# Patient Record
Sex: Female | Born: 1985 | Race: Black or African American | Hispanic: No | Marital: Single | Smoking: Never smoker
Health system: Southern US, Community
[De-identification: ages and names within clinical notes are randomized; demographics above are authoritative.]

## PROBLEM LIST (undated history)

## (undated) ENCOUNTER — Emergency Department: Admission: EM | Payer: Medicaid Other

## (undated) DIAGNOSIS — E079 Disorder of thyroid, unspecified: Secondary | ICD-10-CM

## (undated) DIAGNOSIS — H3500 Unspecified background retinopathy: Secondary | ICD-10-CM

## (undated) DIAGNOSIS — E039 Hypothyroidism, unspecified: Secondary | ICD-10-CM

## (undated) DIAGNOSIS — I10 Essential (primary) hypertension: Secondary | ICD-10-CM

## (undated) DIAGNOSIS — E119 Type 2 diabetes mellitus without complications: Secondary | ICD-10-CM

## (undated) DIAGNOSIS — E669 Obesity, unspecified: Secondary | ICD-10-CM

## (undated) HISTORY — PX: NO PAST SURGERIES: SHX2092

## (undated) HISTORY — DX: Disorder of thyroid, unspecified: E07.9

## (undated) HISTORY — DX: Obesity, unspecified: E66.9

## (undated) HISTORY — DX: Type 2 diabetes mellitus without complications: E11.9

---
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
## 2008-05-26 ENCOUNTER — Emergency Department (HOSPITAL_COMMUNITY): Admission: EM | Admit: 2008-05-26 | Discharge: 2008-05-26 | Payer: Self-pay | Admitting: Emergency Medicine

## 2008-08-08 ENCOUNTER — Other Ambulatory Visit: Admission: RE | Admit: 2008-08-08 | Discharge: 2008-08-08 | Payer: Self-pay | Admitting: Obstetrics and Gynecology

## 2009-04-28 ENCOUNTER — Emergency Department (HOSPITAL_COMMUNITY): Admission: EM | Admit: 2009-04-28 | Discharge: 2009-04-28 | Payer: Self-pay | Admitting: Emergency Medicine

## 2010-05-13 LAB — URINALYSIS, ROUTINE W REFLEX MICROSCOPIC
Glucose, UA: NEGATIVE mg/dL
Hgb urine dipstick: NEGATIVE
Ketones, ur: NEGATIVE mg/dL
Nitrite: NEGATIVE
Protein, ur: NEGATIVE mg/dL
Specific Gravity, Urine: 1.03 (ref 1.005–1.030)
Urobilinogen, UA: 0.2 mg/dL (ref 0.0–1.0)
pH: 5.5 (ref 5.0–8.0)

## 2010-05-13 LAB — DIFFERENTIAL
Basophils Absolute: 0 K/uL (ref 0.0–0.1)
Basophils Relative: 0 % (ref 0–1)
Eosinophils Absolute: 0.2 K/uL (ref 0.0–0.7)
Eosinophils Relative: 4 % (ref 0–5)
Lymphocytes Relative: 31 % (ref 12–46)
Lymphs Abs: 1.7 K/uL (ref 0.7–4.0)
Monocytes Absolute: 0.5 K/uL (ref 0.1–1.0)
Monocytes Relative: 8 % (ref 3–12)
Neutro Abs: 3.1 K/uL (ref 1.7–7.7)
Neutrophils Relative %: 56 % (ref 43–77)

## 2010-05-13 LAB — COMPREHENSIVE METABOLIC PANEL
ALT: 45 U/L — ABNORMAL HIGH (ref 0–35)
AST: 33 U/L (ref 0–37)
Albumin: 3.8 g/dL (ref 3.5–5.2)
Alkaline Phosphatase: 80 U/L (ref 39–117)
BUN: 13 mg/dL (ref 6–23)
CO2: 22 mEq/L (ref 19–32)
Calcium: 8.9 mg/dL (ref 8.4–10.5)
Chloride: 106 mEq/L (ref 96–112)
Creatinine, Ser: 1.15 mg/dL (ref 0.4–1.2)
GFR calc Af Amer: >60 mL/min (ref >60)
GFR calc non Af Amer: 59 mL/min — ABNORMAL LOW (ref >60)
Glucose, Bld: 121 mg/dL — ABNORMAL HIGH (ref 70–99)
Potassium: 3.5 mEq/L (ref 3.5–5.1)
Sodium: 139 mEq/L (ref 135–145)
Total Bilirubin: 0.7 mg/dL (ref 0.3–1.2)
Total Protein: 8.2 g/dL (ref 6.0–8.3)

## 2010-05-13 LAB — CBC
HCT: 46.3 % — ABNORMAL HIGH (ref 36.0–46.0)
Hemoglobin: 15.1 g/dL — ABNORMAL HIGH (ref 12.0–15.0)
MCHC: 32.8 g/dL (ref 30.0–36.0)
MCV: 81.2 fL (ref 78.0–100.0)
Platelets: 321 10*3/uL (ref 150–400)
RBC: 5.7 MIL/uL — ABNORMAL HIGH (ref 3.87–5.11)
RDW: 14.4 % (ref 11.5–15.5)
WBC: 5.5 10*3/uL (ref 4.0–10.5)

## 2010-05-13 LAB — POCT PREGNANCY, URINE: Preg Test, Ur: NEGATIVE

## 2010-05-13 LAB — LIPASE, BLOOD: Lipase: 20 U/L (ref 11–59)

## 2010-05-29 LAB — POCT URINALYSIS DIP (DEVICE)
Bilirubin Urine: NEGATIVE
Glucose, UA: NEGATIVE mg/dL
Hgb urine dipstick: NEGATIVE
Nitrite: NEGATIVE
Protein, ur: NEGATIVE mg/dL
Specific Gravity, Urine: 1.025 (ref 1.005–1.030)
Urobilinogen, UA: 0.2 mg/dL (ref 0.0–1.0)
pH: 5.5 (ref 5.0–8.0)

## 2010-05-29 LAB — POCT PREGNANCY, URINE: Preg Test, Ur: NEGATIVE

## 2010-06-23 IMAGING — CR DG LUMBAR SPINE 2-3V
3 series · 3 of 3 positions shown · non-contrast
Comparison: None

CLINICAL DATA: Low back pain

LUMBAR SPINE - 2-3 VIEW

[view not recorded (1 of 3)]
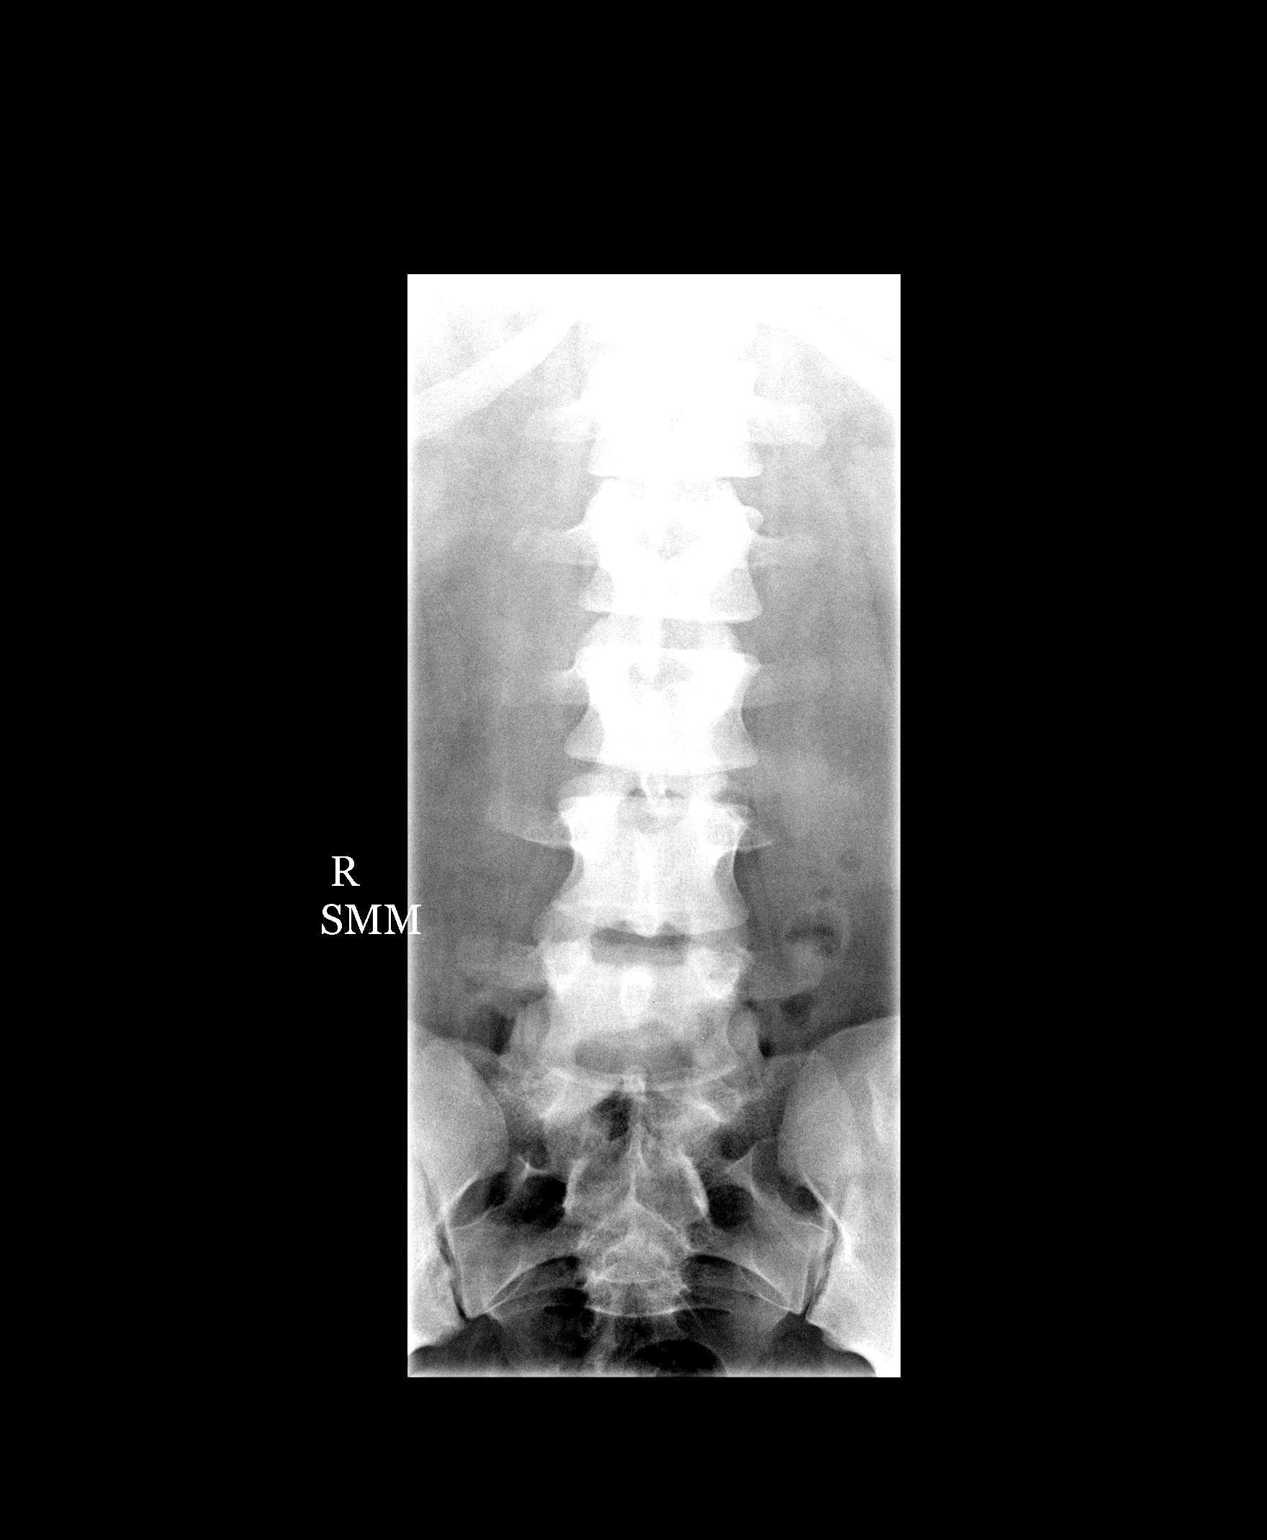

[view not recorded (2 of 3)]
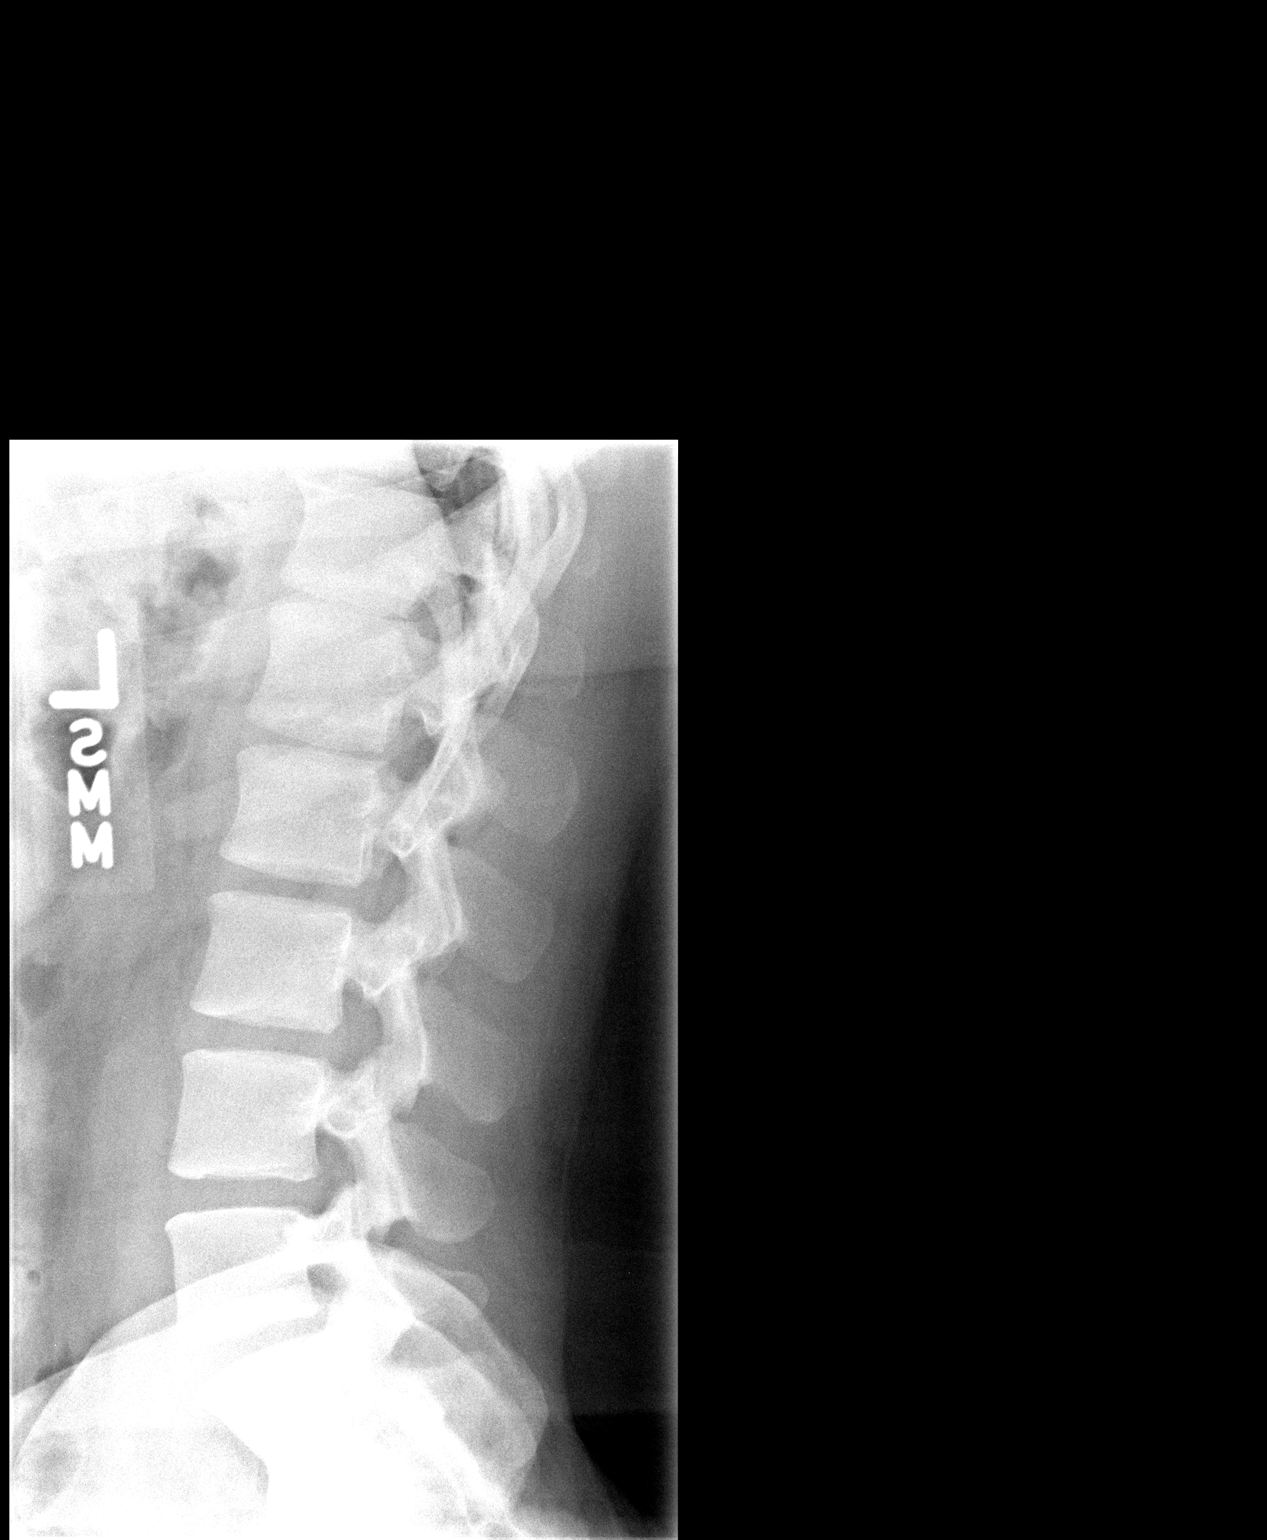

[view not recorded (3 of 3)]
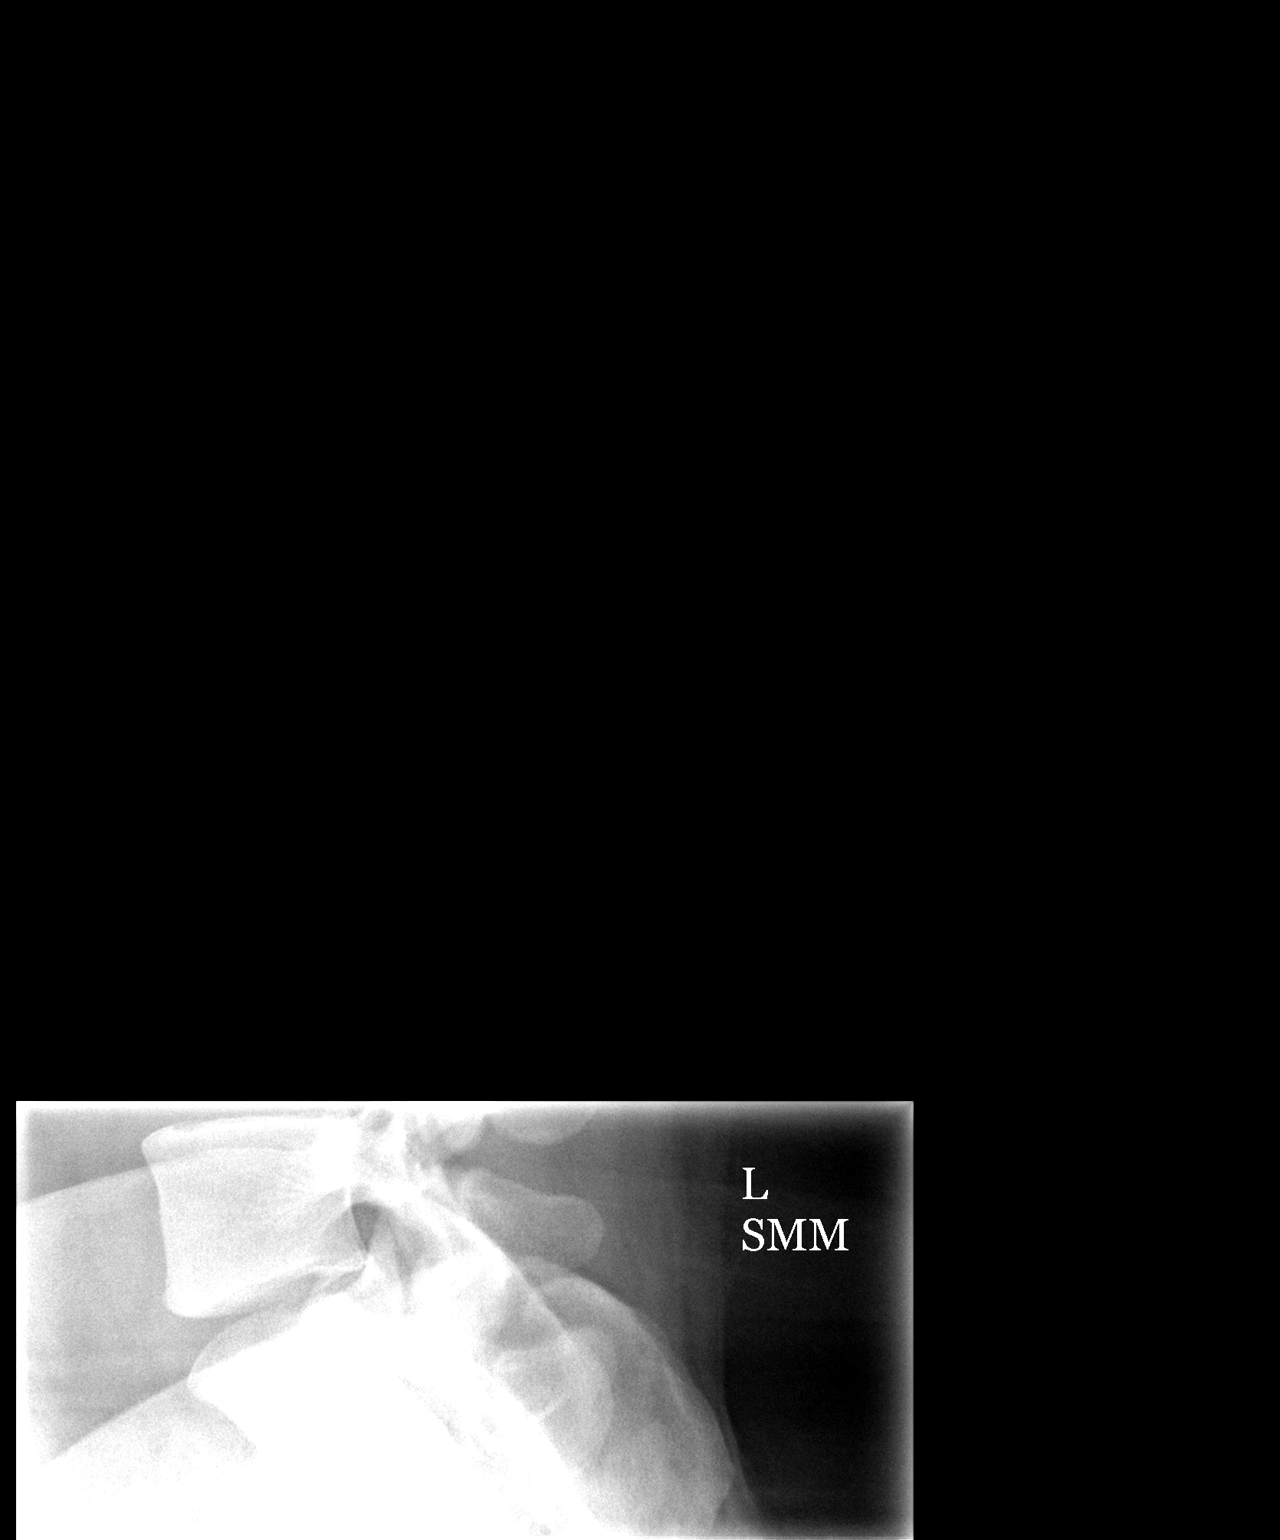

[3 of 3 positions shown; findings below may reference images not displayed]

FINDINGS: Anatomic alignment.  No fracture.  There is no
subluxation.
IMPRESSION: Lumbar spine radiographs are unremarkable.

## 2010-08-05 ENCOUNTER — Inpatient Hospital Stay (INDEPENDENT_AMBULATORY_CARE_PROVIDER_SITE_OTHER)
Admission: RE | Admit: 2010-08-05 | Discharge: 2010-08-05 | Disposition: A | Discharge status: Home / Self Care | Payer: BC Managed Care – PPO | Source: Ambulatory Visit | Attending: Family Medicine | Admitting: Family Medicine

## 2010-08-05 DIAGNOSIS — S335XXA Sprain of ligaments of lumbar spine, initial encounter: Secondary | ICD-10-CM

## 2011-02-25 ENCOUNTER — Other Ambulatory Visit: Payer: Self-pay | Admitting: Nurse Practitioner

## 2011-02-25 ENCOUNTER — Other Ambulatory Visit (HOSPITAL_COMMUNITY)
Admission: RE | Admit: 2011-02-25 | Discharge: 2011-02-25 | Disposition: A | Discharge status: Home / Self Care | Payer: BC Managed Care – PPO | Source: Ambulatory Visit | Attending: Obstetrics and Gynecology | Admitting: Obstetrics and Gynecology

## 2011-02-25 DIAGNOSIS — Z01419 Encounter for gynecological examination (general) (routine) without abnormal findings: Secondary | ICD-10-CM | POA: Insufficient documentation

## 2011-02-25 DIAGNOSIS — Z113 Encounter for screening for infections with a predominantly sexual mode of transmission: Secondary | ICD-10-CM | POA: Insufficient documentation

## 2011-02-25 DIAGNOSIS — N760 Acute vaginitis: Secondary | ICD-10-CM | POA: Insufficient documentation

## 2012-10-12 ENCOUNTER — Encounter: Payer: BC Managed Care – PPO | Attending: Family Medicine

## 2012-10-12 VITALS — Ht 64.0 in | Wt 312.0 lb

## 2012-10-12 DIAGNOSIS — Z713 Dietary counseling and surveillance: Secondary | ICD-10-CM | POA: Insufficient documentation

## 2012-10-12 DIAGNOSIS — E119 Type 2 diabetes mellitus without complications: Secondary | ICD-10-CM

## 2012-10-16 NOTE — Progress Notes (Signed)
Patient was seen on 10/12/12 for the first of a series of three diabetes self-management courses at the [facility].   Current HbA1c: 8.6%  The following learning objectives were met by the patient during this course:   Defines the role of glucose and insulin  Identifies type of diabetes and pathophysiology  Defines the diagnostic criteria for diabetes and prediabetes  States the risk factors for Type 2 Diabetes  States the symptoms of Type 2 Diabetes  Defines Type 2 Diabetes treatment goals  Defines Type 2 Diabetes treatment options  States the rationale for glucose monitoring  Identifies A1C, glucose targets, and testing times  Identifies proper sharps disposal  Defines the purpose of a diabetes food plan  Identifies carbohydrate food groups  Defines effects of carbohydrate foods on glucose levels  Identifies carbohydrate choices/grams/food labels  States benefits of physical activity and effect on glucose  Review of suggested activity guidelines  Handouts given during class include:  Type 2 Diabetes: Basics Book  My Food Plan Book  Food and Activity Log  Your patient has identified their diabetes self-care support plan as:  [facility] support group  Continued diabetes education  Follow-Up Plan: Attend core 2 and core 3

## 2012-10-16 NOTE — Patient Instructions (Signed)
Goals:  Follow Diabetes Meal Plan as instructed  Eat 3 meals and 2 snacks, every 3-5 hrs  Limit carbohydrate intake to 60 grams carbohydrate/meal  Limit carbohydrate intake to 0-30 grams carbohydrate/snack  Add lean protein foods to meals/snacks  Monitor glucose levels as instructed by your doctor  Aim for 15-30 mins of physical activity daily  Bring food record and glucose log to your next nutrition visit 

## 2012-10-19 ENCOUNTER — Encounter: Payer: BC Managed Care – PPO | Attending: Family Medicine

## 2012-10-19 DIAGNOSIS — Z713 Dietary counseling and surveillance: Secondary | ICD-10-CM | POA: Insufficient documentation

## 2012-10-19 DIAGNOSIS — E119 Type 2 diabetes mellitus without complications: Secondary | ICD-10-CM | POA: Insufficient documentation

## 2012-10-20 NOTE — Progress Notes (Signed)
Patient was seen on 10/19/12 for the second of a series of three diabetes self-management courses at the [facility]. The following learning objectives were met by the patient during this course:   Explain basic nutrition maintenance and quality assurance  Describe causes, symptoms and treatment of hypoglycemia and hyperglycemia  Explain how to manage diabetes during illness  Describe the importance of good nutrition for health and healthy eating strategies  List strategies to follow meal plan when dining out  Describe the effects of alcohol on glucose and how to use it safely  Describe problem solving skills for day-to-day glucose challenges  Describe strategies to use when treatment plan needs to change  Identify important factors involved in successful weight loss  Describe ways to remain physically active  Describe the impact of regular activity on insulin resistance  Identify current diabetes medications, their action on blood glucose, and [pssible side effects.  Handouts given in class:  Refrigerator magnet for Sick Day Guidelines  [facility] Oral medication/insulin handout  Your patient has identified their diabetes self-care support plan as:  [facility] support group  Follow-Up Plan: Patient will attend the final class of the ADA Diabetes Self-Care Education.   

## 2013-01-18 ENCOUNTER — Encounter: Payer: BC Managed Care – PPO | Attending: Family Medicine

## 2013-01-18 DIAGNOSIS — E119 Type 2 diabetes mellitus without complications: Secondary | ICD-10-CM | POA: Insufficient documentation

## 2013-01-18 DIAGNOSIS — Z713 Dietary counseling and surveillance: Secondary | ICD-10-CM | POA: Insufficient documentation

## 2013-05-24 ENCOUNTER — Ambulatory Visit: Payer: BC Managed Care – PPO | Admitting: *Deleted

## 2016-10-11 ENCOUNTER — Encounter (HOSPITAL_BASED_OUTPATIENT_CLINIC_OR_DEPARTMENT_OTHER): Payer: Self-pay

## 2016-10-11 ENCOUNTER — Emergency Department (HOSPITAL_BASED_OUTPATIENT_CLINIC_OR_DEPARTMENT_OTHER)
Admission: EM | Admit: 2016-10-11 | Discharge: 2016-10-11 | Disposition: A | Discharge status: Home / Self Care | Payer: PRIVATE HEALTH INSURANCE | Attending: Emergency Medicine | Admitting: Emergency Medicine

## 2016-10-11 ENCOUNTER — Emergency Department (HOSPITAL_BASED_OUTPATIENT_CLINIC_OR_DEPARTMENT_OTHER): Payer: PRIVATE HEALTH INSURANCE

## 2016-10-11 DIAGNOSIS — O209 Hemorrhage in early pregnancy, unspecified: Secondary | ICD-10-CM | POA: Insufficient documentation

## 2016-10-11 DIAGNOSIS — O24111 Pre-existing diabetes mellitus, type 2, in pregnancy, first trimester: Secondary | ICD-10-CM | POA: Insufficient documentation

## 2016-10-11 DIAGNOSIS — Z3A01 Less than 8 weeks gestation of pregnancy: Secondary | ICD-10-CM | POA: Diagnosis not present

## 2016-10-11 DIAGNOSIS — Z79899 Other long term (current) drug therapy: Secondary | ICD-10-CM | POA: Diagnosis not present

## 2016-10-11 DIAGNOSIS — R102 Pelvic and perineal pain: Secondary | ICD-10-CM | POA: Insufficient documentation

## 2016-10-11 DIAGNOSIS — O26891 Other specified pregnancy related conditions, first trimester: Secondary | ICD-10-CM | POA: Insufficient documentation

## 2016-10-11 DIAGNOSIS — E119 Type 2 diabetes mellitus without complications: Secondary | ICD-10-CM | POA: Diagnosis not present

## 2016-10-11 DIAGNOSIS — Z7984 Long term (current) use of oral hypoglycemic drugs: Secondary | ICD-10-CM | POA: Diagnosis not present

## 2016-10-11 DIAGNOSIS — O4690 Antepartum hemorrhage, unspecified, unspecified trimester: Secondary | ICD-10-CM

## 2016-10-11 DIAGNOSIS — O3680X0 Pregnancy with inconclusive fetal viability, not applicable or unspecified: Secondary | ICD-10-CM

## 2016-10-11 LAB — CBC WITH DIFFERENTIAL/PLATELET
Basophils Absolute: 0 10*3/uL (ref 0.0–0.1)
Basophils Relative: 0 %
Eosinophils Absolute: 0.1 10*3/uL (ref 0.0–0.7)
Eosinophils Relative: 1 %
HCT: 39.7 % (ref 36.0–46.0)
Hemoglobin: 13.3 g/dL (ref 12.0–15.0)
Lymphocytes Relative: 20 %
Lymphs Abs: 2.6 10*3/uL (ref 0.7–4.0)
MCH: 26.3 pg (ref 26.0–34.0)
MCHC: 33.5 g/dL (ref 30.0–36.0)
MCV: 78.6 fL (ref 78.0–100.0)
Monocytes Absolute: 0.7 10*3/uL (ref 0.1–1.0)
Monocytes Relative: 5 %
Neutro Abs: 10 10*3/uL — ABNORMAL HIGH (ref 1.7–7.7)
Neutrophils Relative %: 74 %
Platelets: 298 10*3/uL (ref 150–400)
RBC: 5.05 MIL/uL (ref 3.87–5.11)
RDW: 14.9 % (ref 11.5–15.5)
WBC: 13.5 10*3/uL — ABNORMAL HIGH (ref 4.0–10.5)

## 2016-10-11 LAB — BASIC METABOLIC PANEL
Anion gap: 9 (ref 5–15)
BUN: 18 mg/dL (ref 6–20)
CO2: 21 mmol/L — ABNORMAL LOW (ref 22–32)
Calcium: 8.7 mg/dL — ABNORMAL LOW (ref 8.9–10.3)
Chloride: 104 mmol/L (ref 101–111)
Creatinine, Ser: 1.04 mg/dL — ABNORMAL HIGH (ref 0.44–1.00)
GFR calc Af Amer: >60 mL/min (ref >60)
GFR calc non Af Amer: >60 mL/min (ref >60)
Glucose, Bld: 281 mg/dL — ABNORMAL HIGH (ref 65–99)
Potassium: 4.1 mmol/L (ref 3.5–5.1)
Sodium: 134 mmol/L — ABNORMAL LOW (ref 135–145)

## 2016-10-11 LAB — URINALYSIS, ROUTINE W REFLEX MICROSCOPIC
Bilirubin Urine: NEGATIVE
Glucose, UA: >=500 mg/dL — AB
Ketones, ur: 15 mg/dL — AB
Nitrite: NEGATIVE
Protein, ur: 30 mg/dL — AB
Specific Gravity, Urine: 1.031 — ABNORMAL HIGH (ref 1.005–1.030)
pH: 5.5 (ref 5.0–8.0)

## 2016-10-11 LAB — URINALYSIS, MICROSCOPIC (REFLEX)

## 2016-10-11 LAB — HCG, QUANTITATIVE, PREGNANCY: hCG, Beta Chain, Quant, S: 133 m[IU]/mL — ABNORMAL HIGH (ref <5)

## 2016-10-11 MED ORDER — SODIUM CHLORIDE 0.9 % IV BOLUS (SEPSIS)
1000.0000 mL | Freq: Once | INTRAVENOUS | Status: AC
Start: 2016-10-11 — End: 2016-10-11
  Administered 2016-10-11: 1000 mL via INTRAVENOUS

## 2016-10-11 NOTE — ED Triage Notes (Signed)
Pt reports LMP 7/9 - states + home HCG test on Sunday. Reports vaginal bleeding and passing blood clots today. First pregnancy.

## 2016-10-11 NOTE — Discharge Instructions (Signed)
[facility] recheck and reevaluation of the laboratory testing.  The were unable to identify gestational sac in the uterus at this time.  This could be due to the fact that it is an early pregnancy or possible miscarriage or possible ectopic pregnancy.  The labs will need to be repeated, and possibly an ultrasound as needed

## 2016-10-13 NOTE — ED Provider Notes (Signed)
[facility] Provider Note   CSN: [id number] Arrival date & time: 10/11/16  0945     History   Chief Complaint Chief Complaint  Patient presents with  . Vaginal Bleeding    HPI Brenda Casey is a 31 y.o. female.  HPI Patient presents to the emergency department with a positive home pregnancy test.  6 days ago.  The patient states that yesterday she did have some bleeding with some clots noted in the evening.  She states this morning the bleeding was less but still noted some spotting.  Patient states she does not have any significant abdominal discomfort. The patient denies chest pain, shortness of breath, headache,blurred vision, neck pain, fever, cough, weakness, numbness, dizziness, anorexia, edema, abdominal pain, nausea, vomiting, diarrhea, rash, back pain, dysuria, hematemesis, bloody stool, near syncope, or syncope. Past Medical History:  Diagnosis Date  . Diabetes mellitus without complication (HCC)   . Obesity   . Thyroid disease     There are no active problems to display for this patient.   History reviewed. No pertinent surgical history.  OB History    Gravida Para Term Preterm AB Living   1             SAB TAB Ectopic Multiple Live Births                   Home Medications    Prior to Admission medications   Medication Sig Start Date End Date Taking? Authorizing Provider  levothyroxine (SYNTHROID, LEVOTHROID) 25 MCG tablet Take 25 mcg by mouth daily before breakfast.    [provider]  metFORMIN (GLUCOPHAGE) 500 MG tablet Take 500 mg by mouth 2 (two) times daily with a meal.    [provider]    Family History History reviewed. No pertinent family history.  Social History Social History  Substance Use Topics  . Smoking status: Never Smoker  . Smokeless tobacco: Never Used  . Alcohol use No     Allergies   Patient has no known allergies.   Review of Systems Review of Systems All other systems negative except  as documented in the HPI. All pertinent positives and negatives as reviewed in the HPI.  Physical Exam Updated Vital Signs BP (!) 161/95 (BP Location: Right Arm)   Pulse 98   Temp 98.5 F (36.9 C) (Oral)   Resp 19   Ht 5\' 4"  (1.626 m)   Wt 123.8 kg (273 lb)   LMP 08/25/2016   SpO2 100%   BMI 46.86 kg/m   Physical Exam  Constitutional: She is oriented to person, place, and time. She appears well-developed and well-nourished. No distress.  HENT:  Head: Normocephalic and atraumatic.  Mouth/Throat: Oropharynx is clear and moist.  Eyes: Pupils are equal, round, and reactive to light.  Neck: Normal range of motion. Neck supple.  Cardiovascular: Normal rate, regular rhythm and normal heart sounds.  Exam reveals no gallop and no friction rub.   No murmur heard. Pulmonary/Chest: Effort normal and breath sounds normal. No respiratory distress. She has no wheezes.  Abdominal: Soft. Bowel sounds are normal. She exhibits no distension and no mass. There is no tenderness. There is no rebound and no guarding.  Neurological: She is alert and oriented to person, place, and time. She exhibits normal muscle tone. Coordination normal.  Skin: Skin is warm and dry. Capillary refill takes less than 2 seconds. No rash noted. No erythema.  Psychiatric: She has a normal mood and affect. Her behavior  is normal.  Nursing note and vitals reviewed.    ED Treatments / Results  Labs (all labs ordered are listed, but only abnormal results are displayed) Labs Reviewed  BASIC METABOLIC PANEL - Abnormal; Notable for the following:       Result Value   Sodium 134 (*)    CO2 21 (*)    Glucose, Bld 281 (*)    Creatinine, Ser 1.04 (*)    Calcium 8.7 (*)    All other components within normal limits  CBC WITH DIFFERENTIAL/PLATELET - Abnormal; Notable for the following:    WBC 13.5 (*)    Neutro Abs 10.0 (*)    All other components within normal limits  URINALYSIS, ROUTINE W REFLEX MICROSCOPIC - Abnormal;  Notable for the following:    Specific Gravity, Urine 1.031 (*)    Glucose, UA >=500 (*)    Hgb urine dipstick LARGE (*)    Ketones, ur 15 (*)    Protein, ur 30 (*)    Leukocytes, UA SMALL (*)    All other components within normal limits  HCG, QUANTITATIVE, PREGNANCY - Abnormal; Notable for the following:    hCG, Beta Chain, Quant, S 133 (*)    All other components within normal limits  URINALYSIS, MICROSCOPIC (REFLEX) - Abnormal; Notable for the following:    Bacteria, UA MANY (*)    Squamous Epithelial / LPF 0-5 (*)    All other components within normal limits    EKG  EKG Interpretation None       Radiology No results found.  Procedures Procedures (including critical care time)  Medications Ordered in ED Medications  sodium chloride 0.9 % bolus 1,000 mL (0 mLs Intravenous Stopped 10/11/16 1305)     Initial Impression / Assessment and Plan / ED Course  I have reviewed the triage vital signs and the nursing notes.  Pertinent labs & imaging results that were available during my care of the patient were reviewed by me and considered in my medical decision making (see chart for details).     The patient will have an ultrasound.  Her Sharene Butters is fairly low, and there is no intrauterine pregnancy identified at this time.  [facility] the patient and they will follow up with her in 2 days for a recheck of her Quant.. Patient is advised this still could represent ectopic pregnancy or possible miscarriage.  Patient agrees the plan and all questions were answered  Final Clinical Impressions(s) / ED Diagnoses   Final diagnoses:  Pregnancy of unknown anatomic location    New Prescriptions Discharge Medication List as of 10/11/2016  1:01 PM       Charlestine Night, PA-C 10/13/16 1625    Tilden Fossa, MD 10/15/16 [phone]

## 2017-10-24 ENCOUNTER — Encounter (HOSPITAL_BASED_OUTPATIENT_CLINIC_OR_DEPARTMENT_OTHER): Payer: Self-pay | Admitting: Emergency Medicine

## 2017-10-24 ENCOUNTER — Emergency Department (HOSPITAL_BASED_OUTPATIENT_CLINIC_OR_DEPARTMENT_OTHER)
Admission: EM | Admit: 2017-10-24 | Discharge: 2017-10-25 | Disposition: A | Discharge status: Home / Self Care | Payer: PRIVATE HEALTH INSURANCE | Attending: Emergency Medicine | Admitting: Emergency Medicine

## 2017-10-24 ENCOUNTER — Other Ambulatory Visit: Payer: Self-pay

## 2017-10-24 DIAGNOSIS — Z79899 Other long term (current) drug therapy: Secondary | ICD-10-CM | POA: Insufficient documentation

## 2017-10-24 DIAGNOSIS — E079 Disorder of thyroid, unspecified: Secondary | ICD-10-CM | POA: Insufficient documentation

## 2017-10-24 DIAGNOSIS — J020 Streptococcal pharyngitis: Secondary | ICD-10-CM | POA: Insufficient documentation

## 2017-10-24 DIAGNOSIS — E119 Type 2 diabetes mellitus without complications: Secondary | ICD-10-CM | POA: Insufficient documentation

## 2017-10-24 DIAGNOSIS — L02416 Cutaneous abscess of left lower limb: Secondary | ICD-10-CM

## 2017-10-24 DIAGNOSIS — R509 Fever, unspecified: Secondary | ICD-10-CM | POA: Insufficient documentation

## 2017-10-24 DIAGNOSIS — R070 Pain in throat: Secondary | ICD-10-CM | POA: Insufficient documentation

## 2017-10-24 LAB — GROUP A STREP BY PCR: Group A Strep by PCR: DETECTED — AB

## 2017-10-24 MED ORDER — ACETAMINOPHEN 325 MG PO TABS
650.0000 mg | ORAL_TABLET | Freq: Once | ORAL | Status: AC | PRN
  Administered 2017-10-24: 650 mg via ORAL
  Filled 2017-10-24: qty 2

## 2017-10-24 NOTE — ED Provider Notes (Signed)
[facility] EMERGENCY DEPARTMENT Provider Note   CSN: [id number] Arrival date & time: 10/24/17  2117     History   Chief Complaint Chief Complaint  Patient presents with  . Insect Bite    HPI Brenda Casey is a 32 y.o. female.  The history is provided by the patient.  She has history of diabetes, and comes in complaining of 2 bumps in her left thigh posteriorly.  They have been present for about 3 days.  1 of them is tender and the other is not.  She thought it might of been an insect bite and she tried soaking in some warm water without any improvement.  She was not aware of any fever, but was noted to have a fever at triage.  She denies chills or sweats.  She is also had a sore throat for the last 5 days.  Past Medical History:  Diagnosis Date  . Diabetes mellitus without complication (HCC)   . Obesity   . Thyroid disease     There are no active problems to display for this patient.   History reviewed. No pertinent surgical history.   OB History    Gravida  1   Para      Term      Preterm      AB      Living        SAB      TAB      Ectopic      Multiple      Live Births               Home Medications    Prior to Admission medications   Medication Sig Start Date End Date Taking? Authorizing Provider  levothyroxine (SYNTHROID, LEVOTHROID) 25 MCG tablet Take 25 mcg by mouth daily before breakfast.    [provider]  metFORMIN (GLUCOPHAGE) 500 MG tablet Take 500 mg by mouth 2 (two) times daily with a meal.    [provider]    Family History No family history on file.  Social History Social History   Tobacco Use  . Smoking status: Never Smoker  . Smokeless tobacco: Never Used  Substance Use Topics  . Alcohol use: No  . Drug use: No     Allergies   Patient has no known allergies.   Review of Systems Review of Systems  All other systems reviewed and are negative.    Physical Exam Updated  Vital Signs BP (!) 165/112 (BP Location: Right Arm)   Pulse 93   Temp 99.1 F (37.3 C) (Oral)   Resp 20   Ht 5\' 4"  (1.626 m)   Wt 115.2 kg   LMP 09/30/2017   SpO2 100%   Breastfeeding? Unknown   BMI 43.60 kg/m   Physical Exam  Nursing note and vitals reviewed.  32 year old female, resting comfortably and in no acute distress. Vital signs are significant for elevated blood pressure and low-grade fever. Oxygen saturation is 100%, which is normal. Head is normocephalic and atraumatic. PERRLA, EOMI. Oropharynx shows tonsillar hypertrophy without erythema or exudate. Neck is nontender and supple without adenopathy or JVD. Back is nontender and there is no CVA tenderness. Lungs are clear without rales, wheezes, or rhonchi. Chest is nontender. Heart has regular rate and rhythm without murmur. Abdomen is soft, flat, nontender without masses or hepatosplenomegaly and peristalsis is normoactive. Extremities have no cyanosis or edema, full range of motion is present.  Indurated area in  the left posterior thigh approximately measuring 5 cm x 5 cm.  Second area of induration which is nontender and measures 1 cm x 1 cm. Skin is warm and dry without other rash. Neurologic: Mental status is normal, cranial nerves are intact, there are no motor or sensory deficits.  ED Treatments / Results  Labs (all labs ordered are listed, but only abnormal results are displayed) Labs Reviewed  GROUP A STREP BY PCR - Abnormal; Notable for the following components:      Result Value   Group A Strep by PCR DETECTED (*)    All other components within normal limits   Procedures [place] bedside Date/Time: 10/25/2017 12:09 AM Performed by: Dione Booze, MD Authorized by: Dione Booze, MD  Consent: Verbal consent obtained. Written consent not obtained. Risks and benefits: risks, benefits and alternatives were discussed Consent given by: patient Patient understanding: patient states understanding of the procedure  being performed Patient consent: the patient's understanding of the procedure matches consent given Procedure consent: procedure consent matches procedure scheduled Relevant documents: relevant documents present and verified Site marked: the operative site was marked Required items: required blood products, implants, devices, and special equipment available Patient identity confirmed: verbally with patient and arm band Time out: Immediately prior to procedure a "time out" was called to verify the correct patient, procedure, equipment, support staff and site/side marked as required. Local anesthesia used: no  Anesthesia: Local anesthesia used: no  Sedation: Patient sedated: no  Patient tolerance: Patient tolerated the procedure well with no immediate complications Comments: Ultrasound shows apparent fluid collection in the 5 cm x 5 cm lesion, no fluid collection associated with a 1 cm x 1 cm lesion.  Images archived electronically.  Marland Kitchen.Incision and Drainage Date/Time: 10/25/2017 12:10 AM Performed by: Dione Booze, MD Authorized by: Dione Booze, MD   Consent:    Consent obtained:  Verbal   Consent given by:  Patient   Risks discussed:  Bleeding, pain and incomplete drainage   Alternatives discussed:  Alternative treatment Location:    Type:  Abscess   Size:  5 cm x 5 cm   Location:  Lower extremity   Lower extremity location:  Leg   Leg location:  L upper leg Pre-procedure details:    Skin preparation:  Chloraprep Anesthesia (see MAR for exact dosages):    Anesthesia method:  Local infiltration   Local anesthetic:  Lidocaine 1% w/o epi Procedure type:    Complexity:  Complex Procedure details:    Needle aspiration: no     Incision types:  Cruciate   Incision depth:  Subcutaneous   Scalpel blade:  11   Wound management:  Probed and deloculated   Drainage:  Purulent   Drainage amount:  Moderate   Wound treatment:  Wound left open   Packing materials:   None Post-procedure details:    Patient tolerance of procedure:  Tolerated well, no immediate complications    Medications Ordered in ED Medications  acetaminophen (TYLENOL) tablet 650 mg (650 mg Oral Given 10/24/17 2154)     Initial Impression / Assessment and Plan / ED Course  I have reviewed the triage vital signs and the nursing notes.  Pertinent labs & imaging results that were available during my care of the patient were reviewed by me and considered in my medical decision making (see chart for details).  Abscess of the left thigh.  Second lesion is indurated, but ultrasound does not show any fluid collection.  Sore throat.  Strep screen  is positive.  Abscesses treated with incision and drainage.  Will treat with oral doxycycline which should treat MRSA as well as strep adequately  (pregnancy test obtained prior to ordering doxycycline).  Return precautions discussed.  Advised that the small lesion might still develop into a clinical abscess even with taking the antibiotic.  Final Clinical Impressions(s) / ED Diagnoses   Final diagnoses:  Abscess of left thigh  Streptococcal pharyngitis    ED Discharge Orders         Ordered    doxycycline (VIBRAMYCIN) 100 MG capsule  2 times daily     10/25/17 0047           Dione Booze, MD 10/25/17 [phone]

## 2017-10-24 NOTE — ED Triage Notes (Addendum)
C/o ?insect bite under L buttock. 2 raised areas without drainage x 2-3 days. Pt tried warm compress without relief. Pt does not recall feeling a bite or seeing an insect in the area. Pt also noted a sore throat.

## 2017-10-25 LAB — CBG MONITORING, ED: Glucose-Capillary: 279 mg/dL — ABNORMAL HIGH (ref 70–99)

## 2017-10-25 LAB — PREGNANCY, URINE: Preg Test, Ur: NEGATIVE

## 2017-10-25 MED ORDER — DOXYCYCLINE HYCLATE 100 MG PO CAPS: 100.0000 mg | ORAL_CAPSULE | Freq: Two times a day (BID) | ORAL | 0 refills | Status: DC

## 2017-10-25 MED ORDER — LIDOCAINE HCL (PF) 1 % IJ SOLN
5.0000 mL | Freq: Once | INTRAMUSCULAR | Status: DC
  Filled 2017-10-25: qty 5

## 2017-10-25 MED ORDER — DOXYCYCLINE HYCLATE 100 MG PO TABS
100.0000 mg | ORAL_TABLET | Freq: Once | ORAL | Status: AC
  Administered 2017-10-25: 100 mg via ORAL
  Filled 2017-10-25: qty 1

## 2017-10-25 NOTE — ED Notes (Signed)
Pt understood dc material. NAD noted. Script given at dc  

## 2017-10-25 NOTE — Discharge Instructions (Addendum)
Return if you are having any problems.  Beware, even though the smaller lesion on your leg did not show any pus on ultrasound today, sometimes a lesion like this grows into a large abscess, even with taking the antibiotic.

## 2017-10-25 NOTE — ED Notes (Signed)
ED Provider at bedside. 

## 2018-01-24 ENCOUNTER — Encounter (HOSPITAL_BASED_OUTPATIENT_CLINIC_OR_DEPARTMENT_OTHER): Payer: Self-pay | Admitting: Emergency Medicine

## 2018-01-24 ENCOUNTER — Other Ambulatory Visit: Payer: Self-pay

## 2018-01-24 ENCOUNTER — Emergency Department (HOSPITAL_BASED_OUTPATIENT_CLINIC_OR_DEPARTMENT_OTHER)
Admission: EM | Admit: 2018-01-24 | Discharge: 2018-01-24 | Disposition: A | Discharge status: Home / Self Care | Payer: Self-pay | Attending: Emergency Medicine | Admitting: Emergency Medicine

## 2018-01-24 ENCOUNTER — Emergency Department (HOSPITAL_BASED_OUTPATIENT_CLINIC_OR_DEPARTMENT_OTHER): Payer: Self-pay

## 2018-01-24 DIAGNOSIS — E119 Type 2 diabetes mellitus without complications: Secondary | ICD-10-CM | POA: Insufficient documentation

## 2018-01-24 DIAGNOSIS — O24111 Pre-existing diabetes mellitus, type 2, in pregnancy, first trimester: Secondary | ICD-10-CM | POA: Insufficient documentation

## 2018-01-24 DIAGNOSIS — Z79899 Other long term (current) drug therapy: Secondary | ICD-10-CM | POA: Insufficient documentation

## 2018-01-24 DIAGNOSIS — O200 Threatened abortion: Secondary | ICD-10-CM | POA: Insufficient documentation

## 2018-01-24 DIAGNOSIS — Z3A01 Less than 8 weeks gestation of pregnancy: Secondary | ICD-10-CM | POA: Insufficient documentation

## 2018-01-24 DIAGNOSIS — O23591 Infection of other part of genital tract in pregnancy, first trimester: Secondary | ICD-10-CM | POA: Insufficient documentation

## 2018-01-24 DIAGNOSIS — O039 Complete or unspecified spontaneous abortion without complication: Secondary | ICD-10-CM

## 2018-01-24 DIAGNOSIS — N760 Acute vaginitis: Secondary | ICD-10-CM

## 2018-01-24 DIAGNOSIS — Z794 Long term (current) use of insulin: Secondary | ICD-10-CM | POA: Insufficient documentation

## 2018-01-24 DIAGNOSIS — B9689 Other specified bacterial agents as the cause of diseases classified elsewhere: Secondary | ICD-10-CM

## 2018-01-24 LAB — WET PREP, GENITAL
Sperm: NONE SEEN
Trich, Wet Prep: NONE SEEN
Yeast Wet Prep HPF POC: NONE SEEN

## 2018-01-24 LAB — CBC WITH DIFFERENTIAL/PLATELET
Abs Immature Granulocytes: 0.05 10*3/uL (ref 0.00–0.07)
Basophils Absolute: 0 10*3/uL (ref 0.0–0.1)
Basophils Relative: 0 %
Eosinophils Absolute: 0.1 10*3/uL (ref 0.0–0.5)
Eosinophils Relative: 1 %
HCT: 41.9 % (ref 36.0–46.0)
Hemoglobin: 13 g/dL (ref 12.0–15.0)
Immature Granulocytes: 0 %
Lymphocytes Relative: 33 %
Lymphs Abs: 4 10*3/uL (ref 0.7–4.0)
MCH: 24.5 pg — ABNORMAL LOW (ref 26.0–34.0)
MCHC: 31 g/dL (ref 30.0–36.0)
MCV: 78.9 fL — ABNORMAL LOW (ref 80.0–100.0)
Monocytes Absolute: 0.5 10*3/uL (ref 0.1–1.0)
Monocytes Relative: 4 %
Neutro Abs: 7.3 10*3/uL (ref 1.7–7.7)
Neutrophils Relative %: 62 %
Platelets: 370 10*3/uL (ref 150–400)
RBC: 5.31 MIL/uL — ABNORMAL HIGH (ref 3.87–5.11)
RDW: 15.5 % (ref 11.5–15.5)
WBC: 11.9 10*3/uL — ABNORMAL HIGH (ref 4.0–10.5)
nRBC: 0 % (ref 0.0–0.2)

## 2018-01-24 LAB — URINALYSIS, MICROSCOPIC (REFLEX)

## 2018-01-24 LAB — COMPREHENSIVE METABOLIC PANEL
ALT: 19 U/L (ref 0–44)
AST: 16 U/L (ref 15–41)
Albumin: 3.5 g/dL (ref 3.5–5.0)
Alkaline Phosphatase: 61 U/L (ref 38–126)
Anion gap: 9 (ref 5–15)
BUN: 12 mg/dL (ref 6–20)
CO2: 22 mmol/L (ref 22–32)
Calcium: 9.1 mg/dL (ref 8.9–10.3)
Chloride: 104 mmol/L (ref 98–111)
Creatinine, Ser: 0.68 mg/dL (ref 0.44–1.00)
GFR calc Af Amer: >60 mL/min (ref >60)
GFR calc non Af Amer: >60 mL/min (ref >60)
Glucose, Bld: 173 mg/dL — ABNORMAL HIGH (ref 70–99)
Potassium: 3.6 mmol/L (ref 3.5–5.1)
Sodium: 135 mmol/L (ref 135–145)
Total Bilirubin: 0.6 mg/dL (ref 0.3–1.2)
Total Protein: 7.7 g/dL (ref 6.5–8.1)

## 2018-01-24 LAB — URINALYSIS, ROUTINE W REFLEX MICROSCOPIC
Bilirubin Urine: NEGATIVE
Glucose, UA: NEGATIVE mg/dL
Ketones, ur: NEGATIVE mg/dL
Leukocytes, UA: NEGATIVE
Nitrite: NEGATIVE
Protein, ur: NEGATIVE mg/dL
Specific Gravity, Urine: 1.025 (ref 1.005–1.030)
pH: 6 (ref 5.0–8.0)

## 2018-01-24 LAB — HCG, QUANTITATIVE, PREGNANCY: hCG, Beta Chain, Quant, S: 29585 m[IU]/mL — ABNORMAL HIGH (ref <5)

## 2018-01-24 LAB — ABO/RH: ABO/RH(D): O POS

## 2018-01-24 LAB — PREGNANCY, URINE: Preg Test, Ur: POSITIVE — AB

## 2018-01-24 MED ORDER — METRONIDAZOLE 500 MG PO TABS: 500.0000 mg | ORAL_TABLET | Freq: Two times a day (BID) | ORAL | 0 refills | Status: DC

## 2018-01-24 NOTE — ED Notes (Signed)
Patient transported to Ultrasound 

## 2018-01-24 NOTE — Discharge Instructions (Addendum)
You were seen in the ER today for vaginal bleeding.  Your work-up here shows that you are likely miscarrying and that the fetus will not make it to term.  Will need to follow-up very closely with an OB/GYN clinic within the next 2 days for reevaluation.  They are also treating you for bacterial vaginosis with Flagyl, this is an antibiotic, please take this as prescribed you drink alcohol with this medication as it can have severe side effects.  We have prescribed you new medication(s) today. Discuss the medications prescribed today with your pharmacist as they can have adverse effects and interactions with your other medicines including over the counter and prescribed medications. Seek medical evaluation if you start to experience new or abnormal symptoms after taking one of these medicines, seek care immediately if you start to experience difficulty breathing, feeling of your throat closing, facial swelling, or rash as these could be indications of a more serious allergic reaction  Your white blood cell count was slightly high at 11.9, have this rechecked by primary care, your blood sugar was also high at 173, also send in to have rechecked by primary care.  Your blood pressure was high, please get this rechecked at your appointment as well.  Please follow-up with primary care in 1 to 2 weeks, please follow-up with OB/GYN within 2 days.  Please return to the ER immediately should you experience new or worsening symptoms including but not limited to increasing bleeding, lightheadedness, dizziness, passing out, pelvic pain, fever, or any other concerns.

## 2018-01-24 NOTE — ED Triage Notes (Signed)
Pt states she just found out that she is pregnant and today when she wipe out she noticed some bloody secretion, pt states she has hx of spontaneous abortion and she will like to check for this.

## 2018-01-24 NOTE — ED Notes (Signed)
Patient verbalizes understanding of discharge instructions. Opportunity for questioning and answers were provided. Armband removed by staff, pt discharged from ED.  

## 2018-01-24 NOTE — ED Notes (Signed)
EDP at bedside  

## 2018-01-24 NOTE — ED Provider Notes (Signed)
[facility] EMERGENCY DEPARTMENT Provider Note   CSN: [id number] Arrival date & time: 01/24/18  1617   History   Chief Complaint Chief Complaint  Patient presents with  . Routine Prenatal Visit     bloody secretions    HPI Brenda Casey is a 32 y.o. female with a hx of obesity, T2DM, and hypothyroidism who presents to the ED with concern for vaginal bleeding today. She states her LMP was 11/05/17, took pregnancy test 2 weeks ago and it was positive. Has not established care with OB yet. She has been doing well without sxs. Today had two episodes of blood on toilet paper when wiping after urinating. No other specific alleviating/aggravating factors. No intervention PTA. Denies fever, chills, vomiting, abdominal pain/pelvic pain, or vaginal discharge. Sexuaully active in a monogamous relationship without concern for STD. Hx of miscarriage in first trimester last year, no other prior pregnancies.   HPI  Past Medical History:  Diagnosis Date  . Diabetes mellitus without complication (HCC)   . Obesity   . Thyroid disease     There are no active problems to display for this patient.   History reviewed. No pertinent surgical history.   OB History    Gravida  1   Para      Term      Preterm      AB      Living        SAB      TAB      Ectopic      Multiple      Live Births               Home Medications    Prior to Admission medications   Medication Sig Start Date End Date Taking? Authorizing Provider  doxycycline (VIBRAMYCIN) 100 MG capsule Take 1 capsule (100 mg total) by mouth 2 (two) times daily. 10/25/17   Dione Booze, MD  levothyroxine (SYNTHROID, LEVOTHROID) 25 MCG tablet Take 25 mcg by mouth daily before breakfast.    [provider]  metFORMIN (GLUCOPHAGE) 500 MG tablet Take 500 mg by mouth 2 (two) times daily with a meal.    [provider]    Family History No family history on file.  Social History Social  History   Tobacco Use  . Smoking status: Never Smoker  . Smokeless tobacco: Never Used  Substance Use Topics  . Alcohol use: No  . Drug use: No     Allergies   Patient has no known allergies.   Review of Systems Review of Systems  Constitutional: Negative for chills and fever.  Respiratory: Negative for shortness of breath.   Cardiovascular: Negative for chest pain.  Gastrointestinal: Negative for abdominal pain, constipation, diarrhea and vomiting.  Genitourinary: Positive for vaginal bleeding. Negative for dysuria.  All other systems reviewed and are negative.    Physical Exam Updated Vital Signs BP (!) 174/114 (BP Location: Right Arm)   Pulse 87   Temp 98.3 F (36.8 C) (Oral)   Resp 19   Ht 5\' 4"  (1.626 m)   Wt 115.2 kg   LMP 11/05/2017   SpO2 100%   BMI 43.59 kg/m   Physical Exam  Constitutional: She appears well-developed and well-nourished.  Non-toxic appearance. No distress.  HENT:  Head: Normocephalic and atraumatic.  Eyes: Conjunctivae are normal. Right eye exhibits no discharge. Left eye exhibits no discharge.  Neck: Neck supple.  Cardiovascular: Normal rate and regular rhythm.  Pulmonary/Chest: Effort normal and  breath sounds normal. No respiratory distress. She has no wheezes. She has no rhonchi. She has no rales.  Respiration even and unlabored  Abdominal: Soft. She exhibits no distension. There is no tenderness. There is no rebound and no guarding.  Genitourinary: Pelvic exam was performed with patient supine. There is no rash or tenderness on the right labia. There is no rash or tenderness on the left labia. Cervix exhibits no motion tenderness and no friability. Right adnexum displays no mass, no tenderness and no fullness. Left adnexum displays no mass, no tenderness and no fullness. There is bleeding (very mild amount in vaginal vault) in the vagina.  Genitourinary Comments: Cervical os closed.  EDT present as chaperone.   Neurological: She is  alert.  Clear speech.   Skin: Skin is warm and dry. No rash noted.  Psychiatric: She has a normal mood and affect. Her behavior is normal.  Nursing note and vitals reviewed.    ED Treatments / Results  Labs (all labs ordered are listed, but only abnormal results are displayed) Labs Reviewed  WET PREP, GENITAL - Abnormal; Notable for the following components:      Result Value   Clue Cells Wet Prep HPF POC PRESENT (*)    WBC, Wet Prep HPF POC MANY (*)    All other components within normal limits  URINALYSIS, ROUTINE W REFLEX MICROSCOPIC - Abnormal; Notable for the following components:   Hgb urine dipstick TRACE (*)    All other components within normal limits  PREGNANCY, URINE - Abnormal; Notable for the following components:   Preg Test, Ur POSITIVE (*)    All other components within normal limits  URINALYSIS, MICROSCOPIC (REFLEX) - Abnormal; Notable for the following components:   Bacteria, UA MANY (*)    All other components within normal limits  HCG, QUANTITATIVE, PREGNANCY - Abnormal; Notable for the following components:   hCG, Beta Chain, Quant, S 29,585 (*)    All other components within normal limits  CBC WITH DIFFERENTIAL/PLATELET - Abnormal; Notable for the following components:   WBC 11.9 (*)    RBC 5.31 (*)    MCV 78.9 (*)    MCH 24.5 (*)    All other components within normal limits  COMPREHENSIVE METABOLIC PANEL - Abnormal; Notable for the following components:   Glucose, Bld 173 (*)    All other components within normal limits  URINE CULTURE  ABO/RH  GC/CHLAMYDIA PROBE AMP ([place]) [facility]    EKG None  Radiology [place] Ob Comp < 14 Wks  Result Date: 01/24/2018 CLINICAL DATA:  Vaginal spotting today. Positive home pregnancy test. Eleven weeks 3 days pregnant by last menstrual period. Spontaneous abortion 1 year ago. EXAM: OBSTETRIC <14 WK US AND TRANSVAGINAL OB US TECHNIQUE: Both transabdominal and transvaginal ultrasound examinations were performed  for complete evaluation of the gestation as well as the maternal uterus, adnexal regions, and pelvic cul-de-sac. Transvaginal technique was performed to assess early pregnancy. COMPARISON:  10/11/2016. FINDINGS: Intrauterine gestational sac: Visualized Yolk sac:  Not visualized Embryo:  Visualized Cardiac Activity: Not visualized CRL:  13 mm   7 w   3 d                  US EDC: 09/09/2018 Subchorionic hemorrhage:  None visualized. Maternal uterus/adnexae: The ovaries were not visualized. No free peritoneal fluid. 1.1 cm oval anterior uterine mass with coarse central calcification compatible with a small fibroid. IMPRESSION: 1. Findings described above compatible with fetal demise  at 7 weeks and 3 days of pregnancy. 2. 1.1 cm anterior uterine fibroid without a submucosal component. 3. Nonvisualized maternal ovaries. Electronically Signed   By: Beckie Salts M.D.   On: 01/24/2018 20:42   US Ob Transvaginal  Result Date: 01/24/2018 CLINICAL DATA:  Vaginal spotting today. Positive home pregnancy test. Eleven weeks 3 days pregnant by last menstrual period. Spontaneous abortion 1 year ago. EXAM: OBSTETRIC <14 WK [place] AND TRANSVAGINAL OB US TECHNIQUE: Both transabdominal and transvaginal ultrasound examinations were performed for complete evaluation of the gestation as well as the maternal uterus, adnexal regions, and pelvic cul-de-sac. Transvaginal technique was performed to assess early pregnancy. COMPARISON:  10/11/2016. FINDINGS: Intrauterine gestational sac: Visualized Yolk sac:  Not visualized Embryo:  Visualized Cardiac Activity: Not visualized CRL:  13 mm   7 w   3 d                  [place] EDC: 09/09/2018 Subchorionic hemorrhage:  None visualized. Maternal uterus/adnexae: The ovaries were not visualized. No free peritoneal fluid. 1.1 cm oval anterior uterine mass with coarse central calcification compatible with a small fibroid. IMPRESSION: 1. Findings described above compatible with fetal demise at 7 weeks and 3  days of pregnancy. 2. 1.1 cm anterior uterine fibroid without a submucosal component. 3. Nonvisualized maternal ovaries. Electronically Signed   By: Beckie Salts M.D.   On: 01/24/2018 20:42    Procedures Procedures (including critical care time)  Medications Ordered in ED Medications - No data to display   Initial Impression / Assessment and Plan / ED Course  I have reviewed the triage vital signs and the nursing notes.  Pertinent labs & imaging results that were available during my care of the patient were reviewed by me and considered in my medical decision making (see chart for details).   Patient presents to the ED w/ at home positive pregnancy test 2 weeks prior, LMP 11/05/17, now with two episodes of vaginal bleeding when wiping. Nontoxic appearing, vitals WNL with the exception of elevated BP- doubt HTN emergency, elevated at prior ER/office visits- PCP recheck. No abdominal tenderness or adnexal tenderness. Mild amount of bleeding noted on pelvic. DDX: IUP, spontaneous abortion, ectopic. Less likely PID given patient has no concern for STD and no tenderness on exam.  Further evaluation with lab work and [place].   Labs reviewed: nonspecific leukocytosis at 11.9. Hgb/hct WNL. No electrolyte disturbance. LFTs/renal function WNL. Hyperglycemia, hx of DM.   Ultrasound w/ findings consistent w/ fetal demise at 7 weeks and 3 days of pregnancy. Also noted 1.1 cm anterior uterine fibroid without a submucosal component. Rh positive.   UA w/ many bacteria, no urinary sxs, given miscarrying no need for tx of asymptomatic bacteriuria, culture pending. Wet prep with BV, flagyl, discussed no EtOH with this medicine. Discussed findings and plan of care with supervising physician Dr. Juleen China- advises discharge home with return precautions, obgyn follow up- I am in agreement. I discussed results, treatment plan, need for follow-up w/ obgyn as well as with PCP for BP recheck, and return precautions with the  patient. Provided opportunity for questions, patient confirmed understanding and is in agreement with plan.   Final Clinical Impressions(s) / ED Diagnoses   Final diagnoses:  Bacterial vaginosis  Miscarriage    ED Discharge Orders         Ordered    metroNIDAZOLE (FLAGYL) 500 MG tablet  2 times daily     01/24/18 2219  Desmond Lope 01/24/18 2354    Raeford Razor, MD 01/28/18 [phone]

## 2018-01-25 LAB — GC/CHLAMYDIA PROBE AMP (~~LOC~~) NOT AT ARMC
Chlamydia: NEGATIVE
Neisseria Gonorrhea: NEGATIVE

## 2018-01-26 LAB — URINE CULTURE: Culture: 7000 — AB

## 2018-01-27 ENCOUNTER — Telehealth: Payer: Self-pay | Admitting: Emergency Medicine

## 2018-01-27 NOTE — Telephone Encounter (Signed)
Post ED Visit - Positive Culture Follow-up  Culture report reviewed by antimicrobial stewardship pharmacist:  []  Enzo BiNathan Batchelder, Pharm.D. []  Celedonio MiyamotoJeremy Frens, Pharm.D., BCPS AQ-ID []  Garvin FilaMike Maccia, Pharm.D., BCPS []  Georgina PillionElizabeth Martin, [address]D., BCPS []  BrushMinh Pham, [address]Pharm[address]D., BCPS, AAHIVP []  Estella HuskMichelle Turner, Pharm.D., BCPS, AAHIVP [x]  Lysle Pearlachel Rumbarger, PharmD, BCPS []  Phillips Climeshuy Dang, PharmD, BCPS []  Agapito GamesAlison Masters, PharmD, BCPS []  Verlan FriendsErin Deja, PharmD  Positive urine culture Treated with metronidzole, organism sensitive to the same and no further patient follow-up is required at this time.  Carollee HerterShannon Sharma Lawrance 01/27/2018, 1:45 PM

## 2018-07-30 DIAGNOSIS — I10 Essential (primary) hypertension: Secondary | ICD-10-CM | POA: Insufficient documentation

## 2018-07-30 DIAGNOSIS — Z8639 Personal history of other endocrine, nutritional and metabolic disease: Secondary | ICD-10-CM | POA: Insufficient documentation

## 2018-07-30 DIAGNOSIS — E11319 Type 2 diabetes mellitus with unspecified diabetic retinopathy without macular edema: Secondary | ICD-10-CM | POA: Insufficient documentation

## 2018-07-30 DIAGNOSIS — E119 Type 2 diabetes mellitus without complications: Secondary | ICD-10-CM | POA: Insufficient documentation

## 2018-07-30 HISTORY — DX: Morbid (severe) obesity due to excess calories: E66.01

## 2018-09-08 DIAGNOSIS — E113393 Type 2 diabetes mellitus with moderate nonproliferative diabetic retinopathy without macular edema, bilateral: Secondary | ICD-10-CM | POA: Insufficient documentation

## 2018-11-12 ENCOUNTER — Other Ambulatory Visit: Payer: Self-pay

## 2018-11-12 ENCOUNTER — Inpatient Hospital Stay (HOSPITAL_COMMUNITY): Payer: Managed Care, Other (non HMO)

## 2018-11-12 ENCOUNTER — Encounter (HOSPITAL_COMMUNITY): Payer: Self-pay | Admitting: Certified Nurse Midwife

## 2018-11-12 ENCOUNTER — Inpatient Hospital Stay (HOSPITAL_COMMUNITY)
Admission: AD | Admit: 2018-11-12 | Discharge: 2018-11-12 | Disposition: A | Discharge status: Home / Self Care | Payer: Managed Care, Other (non HMO) | Attending: Obstetrics and Gynecology | Admitting: Obstetrics and Gynecology

## 2018-11-12 DIAGNOSIS — O161 Unspecified maternal hypertension, first trimester: Secondary | ICD-10-CM | POA: Insufficient documentation

## 2018-11-12 DIAGNOSIS — Z3A10 10 weeks gestation of pregnancy: Secondary | ICD-10-CM | POA: Diagnosis not present

## 2018-11-12 DIAGNOSIS — O99281 Endocrine, nutritional and metabolic diseases complicating pregnancy, first trimester: Secondary | ICD-10-CM | POA: Insufficient documentation

## 2018-11-12 DIAGNOSIS — O24311 Unspecified pre-existing diabetes mellitus in pregnancy, first trimester: Secondary | ICD-10-CM

## 2018-11-12 DIAGNOSIS — Z79899 Other long term (current) drug therapy: Secondary | ICD-10-CM | POA: Insufficient documentation

## 2018-11-12 DIAGNOSIS — E039 Hypothyroidism, unspecified: Secondary | ICD-10-CM | POA: Insufficient documentation

## 2018-11-12 DIAGNOSIS — O4690 Antepartum hemorrhage, unspecified, unspecified trimester: Secondary | ICD-10-CM

## 2018-11-12 DIAGNOSIS — O4691 Antepartum hemorrhage, unspecified, first trimester: Secondary | ICD-10-CM

## 2018-11-12 DIAGNOSIS — Z3A01 Less than 8 weeks gestation of pregnancy: Secondary | ICD-10-CM | POA: Diagnosis not present

## 2018-11-12 DIAGNOSIS — Z3A08 8 weeks gestation of pregnancy: Secondary | ICD-10-CM

## 2018-11-12 DIAGNOSIS — O10911 Unspecified pre-existing hypertension complicating pregnancy, first trimester: Secondary | ICD-10-CM

## 2018-11-12 DIAGNOSIS — O24319 Unspecified pre-existing diabetes mellitus in pregnancy, unspecified trimester: Secondary | ICD-10-CM

## 2018-11-12 DIAGNOSIS — Z794 Long term (current) use of insulin: Secondary | ICD-10-CM | POA: Diagnosis not present

## 2018-11-12 DIAGNOSIS — O26851 Spotting complicating pregnancy, first trimester: Secondary | ICD-10-CM | POA: Diagnosis present

## 2018-11-12 DIAGNOSIS — O99211 Obesity complicating pregnancy, first trimester: Secondary | ICD-10-CM | POA: Insufficient documentation

## 2018-11-12 DIAGNOSIS — Z79890 Hormone replacement therapy (postmenopausal): Secondary | ICD-10-CM | POA: Insufficient documentation

## 2018-11-12 DIAGNOSIS — Z6790 Unspecified blood type, Rh positive: Secondary | ICD-10-CM

## 2018-11-12 DIAGNOSIS — O10919 Unspecified pre-existing hypertension complicating pregnancy, unspecified trimester: Secondary | ICD-10-CM

## 2018-11-12 DIAGNOSIS — E669 Obesity, unspecified: Secondary | ICD-10-CM | POA: Insufficient documentation

## 2018-11-12 DIAGNOSIS — O24414 Gestational diabetes mellitus in pregnancy, insulin controlled: Secondary | ICD-10-CM | POA: Insufficient documentation

## 2018-11-12 HISTORY — DX: Hypothyroidism, unspecified: E03.9

## 2018-11-12 HISTORY — DX: Essential (primary) hypertension: I10

## 2018-11-12 HISTORY — DX: Unspecified background retinopathy: H35.00

## 2018-11-12 LAB — URINALYSIS, ROUTINE W REFLEX MICROSCOPIC
Bacteria, UA: NONE SEEN
Bilirubin Urine: NEGATIVE
Glucose, UA: >=500 mg/dL — AB
Ketones, ur: 5 mg/dL — AB
Leukocytes,Ua: NEGATIVE
Nitrite: NEGATIVE
Protein, ur: NEGATIVE mg/dL
Specific Gravity, Urine: 1.018 (ref 1.005–1.030)
pH: 5 (ref 5.0–8.0)

## 2018-11-12 LAB — WET PREP, GENITAL
Sperm: NONE SEEN
Trich, Wet Prep: NONE SEEN
Yeast Wet Prep HPF POC: NONE SEEN

## 2018-11-12 LAB — CBC
HCT: 40.4 % (ref 36.0–46.0)
Hemoglobin: 13.8 g/dL (ref 12.0–15.0)
MCH: 27.5 pg (ref 26.0–34.0)
MCHC: 34.2 g/dL (ref 30.0–36.0)
MCV: 80.6 fL (ref 80.0–100.0)
Platelets: 377 10*3/uL (ref 150–400)
RBC: 5.01 MIL/uL (ref 3.87–5.11)
RDW: 13.7 % (ref 11.5–15.5)
WBC: 12.3 10*3/uL — ABNORMAL HIGH (ref 4.0–10.5)
nRBC: 0 % (ref 0.0–0.2)

## 2018-11-12 LAB — HCG, QUANTITATIVE, PREGNANCY: hCG, Beta Chain, Quant, S: 46888 m[IU]/mL — ABNORMAL HIGH (ref <5)

## 2018-11-12 MED ORDER — LABETALOL HCL 200 MG PO TABS: 200.0000 mg | ORAL_TABLET | Freq: Two times a day (BID) | ORAL | 1 refills | Status: DC

## 2018-11-12 MED ORDER — LABETALOL HCL 100 MG PO TABS
200.0000 mg | ORAL_TABLET | Freq: Two times a day (BID) | ORAL | Status: DC
  Administered 2018-11-12: 200 mg via ORAL
  Filled 2018-11-12: qty 2

## 2018-11-12 NOTE — MAU Note (Signed)
Went to the bathroom this morning and found some blood.  Recently did a preg test, and it was positive. preg test confirmed at primary phys 9/17, has documentation

## 2018-11-12 NOTE — MAU Provider Note (Signed)
History     CSN: [id number]  Arrival date and time: 11/12/18 1518   First Provider Initiated Contact with Patient 11/12/18 1612      Chief Complaint  Patient presents with  . Vaginal Bleeding  . Possible Pregnancy   33 y.o. G3P0020 @10 .3 wks by sure LMP with hx of uncontrolled DM and HTN presenting with spotting. Reports seeing a small spot on the toilet paper when she wiped this am. Bleeding has been scant since. No recent sex. Denies abd pain or cramping. Denies vaginal discharge, itching, or odor. She recently switched to Labetalol 100 mg bid and took a dose today.   OB History    Gravida  3   Para      Term      Preterm      AB  2   Living        SAB  2   TAB      Ectopic      Multiple      Live Births              Past Medical History:  Diagnosis Date  . Diabetes mellitus without complication (HCC)   . Essential hypertension   . Hypothyroidism   . Obesity   . Retinopathy   . Thyroid disease     History reviewed. No pertinent surgical history.  History reviewed. No pertinent family history.  Social History   Tobacco Use  . Smoking status: Never Smoker  . Smokeless tobacco: Never Used  Substance Use Topics  . Alcohol use: No  . Drug use: No    Allergies:  Allergies  Allergen Reactions  . Dairy Aid [employer][Lactase] Hives  . Other Hives    MSG  . Soy Allergy Hives    Medications Prior to Admission  Medication Sig Dispense Refill Last Dose  . doxycycline (VIBRAMYCIN) 100 MG capsule Take 1 capsule (100 mg total) by mouth 2 (two) times daily. 20 capsule 0   . insulin glargine (LANTUS) 100 UNIT/ML injection Inject 10 Units into the skin daily.     Marland Kitchen. labetalol (NORMODYNE) 100 MG tablet Take 100 mg by mouth 2 (two) times daily.     Marland Kitchen. levothyroxine (SYNTHROID, LEVOTHROID) 25 MCG tablet Take 25 mcg by mouth daily before breakfast.     . metFORMIN (GLUCOPHAGE) 500 MG tablet Take 500 mg by mouth 2 (two) times daily with a meal.     . metroNIDAZOLE  (FLAGYL) 500 MG tablet Take 1 tablet (500 mg total) by mouth 2 (two) times daily. 14 tablet 0   . Prenatal Vit-Fe Fumarate-FA (MULTIVITAMIN-PRENATAL) 27-0.8 MG TABS tablet Take 1 tablet by mouth daily at 12 noon.       Review of Systems  Constitutional: Negative for chills and fever.  Gastrointestinal: Negative for abdominal pain, constipation and diarrhea.  Genitourinary: Positive for vaginal bleeding. Negative for vaginal discharge.   Physical Exam   Blood pressure (!) 174/104, pulse (!) 105, temperature 98.3 F (36.8 C), temperature source Oral, resp. rate 18, height 5' 4.5" (1.638 m), weight 125.2 kg, last menstrual period 08/31/2018, SpO2 100 %, unknown if currently breastfeeding. Patient Vitals for the past 24 hrs:  BP Temp Temp src Pulse Resp SpO2 Height Weight  11/12/18 1625 (!) 174/104 - - (!) 105 - - - -  11/12/18 1537 (!) 166/97 98.3 F (36.8 C) Oral 99 18 100 % 5' 4.5" (1.638 m) 125.2 kg   Physical Exam  Nursing note and vitals reviewed. Constitutional:  She is oriented to person, place, and time. She appears well-developed and well-nourished. No distress.  HENT:  Head: Normocephalic and atraumatic.  Neck: Normal range of motion.  Cardiovascular: Normal rate.  Respiratory: Effort normal. No respiratory distress.  GI: Soft. She exhibits no distension and no mass. There is no abdominal tenderness. There is no rebound and no guarding.  Genitourinary:    Genitourinary Comments: External: no lesions or erythema Vagina: rugated, pink, moist, scant pink discharge Uterus: non enlarged, anteverted, non tender, no CMT Adnexae: no masses, no tenderness left, no tenderness right Cervix closed    Musculoskeletal: Normal range of motion.  Neurological: She is alert and oriented to person, place, and time.  Skin: Skin is warm and dry.  Psychiatric: She has a normal mood and affect.   Results for orders placed or performed during the hospital encounter of 11/12/18 (from the past 24  hour(s))  Urinalysis, Routine w reflex microscopic     Status: Abnormal   Collection Time: 11/12/18  3:40 PM  Result Value Ref Range   Color, Urine STRAW (A) YELLOW   APPearance CLEAR CLEAR   Specific Gravity, Urine 1.018 1.005 - 1.030   pH 5.0 5.0 - 8.0   Glucose, UA >=500 (A) NEGATIVE mg/dL   Hgb urine dipstick MODERATE (A) NEGATIVE   Bilirubin Urine NEGATIVE NEGATIVE   Ketones, ur 5 (A) NEGATIVE mg/dL   Protein, ur NEGATIVE NEGATIVE mg/dL   Nitrite NEGATIVE NEGATIVE   Leukocytes,Ua NEGATIVE NEGATIVE   WBC, UA 0-5 0 - 5 WBC/hpf   Bacteria, UA NONE SEEN NONE SEEN   Squamous Epithelial / LPF 0-5 0 - 5  Wet prep, genital     Status: Abnormal   Collection Time: 11/12/18  4:22 PM   Specimen: PATH Cytology Cervicovaginal Ancillary Only  Result Value Ref Range   Yeast Wet Prep HPF POC NONE SEEN NONE SEEN   Trich, Wet Prep NONE SEEN NONE SEEN   Clue Cells Wet Prep HPF POC PRESENT (A) NONE SEEN   WBC, Wet Prep HPF POC MANY (A) NONE SEEN   Sperm NONE SEEN   CBC     Status: Abnormal   Collection Time: 11/12/18  4:31 PM  Result Value Ref Range   WBC 12.3 (H) 4.0 - 10.5 K/uL   RBC 5.01 3.87 - 5.11 MIL/uL   Hemoglobin 13.8 12.0 - 15.0 g/dL   HCT 40.4 36.0 - 46.0 %   MCV 80.6 80.0 - 100.0 fL   MCH 27.5 26.0 - 34.0 pg   MCHC 34.2 30.0 - 36.0 g/dL   RDW 13.7 11.5 - 15.5 %   Platelets 377 150 - 400 K/uL   nRBC 0.0 0.0 - 0.2 %  hCG, quantitative, pregnancy     Status: Abnormal   Collection Time: 11/12/18  4:31 PM  Result Value Ref Range   hCG, Beta Chain, Quant, S 46,888 (H) <5 mIU/mL   US Ob Comp Less 14 Wks  Result Date: 11/12/2018 CLINICAL DATA:  Early pregnancy. EXAM: OBSTETRIC <14 WK ULTRASOUND TECHNIQUE: Transabdominal ultrasound was performed for evaluation of the gestation as well as the maternal uterus and adnexal regions. COMPARISON:  None. FINDINGS: Intrauterine gestational sac: Present Yolk sac:  Present Embryo:  Present Cardiac Activity: Present Heart Rate: 193 bpm CRL:    19.93 mm   8 w 3 d                  [place] EDC: 06/21/2019 Subchorionic hemorrhage:  None visualized. Maternal uterus/adnexae:  Right ovary 4.6 x 3.4 x 4.8 cm. Left ovary 2.8 x 1.6 x 1.7 cm. No free fluid. IMPRESSION: Normal appearing early single intrauterine pregnancy at 8 weeks 3 days by crown-rump length. Electronically Signed   By: Paulina Fusi M.D.   On: 11/12/2018 17:13   MAU Course  Procedures  MDM Labs and [place] ordered and reviewed. Viable 8 wk IUP on [place], no SCH. No evidence of impending SAB. BP not controlled, will increase Labetalol to 200 bid. Stable for discharge home.   Assessment and Plan   1. [redacted] weeks gestation of pregnancy   2. Vaginal bleeding in pregnancy   3. Blood type, Rh positive   4. Chronic hypertension affecting pregnancy   5. 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    Discharge home Follow up at [facility] as scheduled SAB precautions Rx Labetalol 200 bid Continue Insulin and BS monitoring  Allergies as of 11/12/2018      Reactions   Dairy Aid [lactase] Hives   Other Hives   MSG   Soy Allergy Hives      Medication List    STOP taking these medications   doxycycline 100 MG capsule Commonly known as: VIBRAMYCIN   metFORMIN 500 MG tablet Commonly known as: GLUCOPHAGE   metroNIDAZOLE 500 MG tablet Commonly known as: FLAGYL     TAKE these medications   insulin glargine 100 UNIT/ML injection Commonly known as: LANTUS Inject 10 Units into the skin daily.   labetalol 200 MG tablet Commonly known as: NORMODYNE Take 1 tablet (200 mg total) by mouth 2 (two) times daily. Start taking on: November 13, 2018 What changed:   medication strength  how much to take   levothyroxine 25 MCG tablet Commonly known as: SYNTHROID Take 25 mcg by mouth daily before breakfast.   multivitamin-prenatal 27-0.8 MG Tabs tablet Take 1 tablet by mouth daily at 12 noon.      Donette Larry, CNM 11/12/2018, 5:55 PM

## 2018-11-12 NOTE — Discharge Instructions (Signed)
Vaginal Bleeding During Pregnancy, First Trimester ° °A small amount of bleeding (spotting) from the vagina is common during early pregnancy. Sometimes the bleeding is normal and does not cause problems. At other times, though, bleeding may be a sign of something serious. Tell your doctor about any bleeding from your vagina right away. °Follow these instructions at home: °Activity °· Follow your doctor's instructions about how active you can be. °· If needed, make plans for someone to help with your normal activities. °· Do not have sex or orgasms until your doctor says that this is safe. °General instructions °· Take over-the-counter and prescription medicines only as told by your doctor. °· Watch your condition for any changes. °· Write down: °? The number of pads you use each day. °? How often you change pads. °? How soaked (saturated) your pads are. °· Do not use tampons. °· Do not douche. °· If you pass any tissue from your vagina, save it to show to your doctor. °· Keep all follow-up visits as told by your doctor. This is important. °Contact a doctor if: °· You have vaginal bleeding at any time while you are pregnant. °· You have cramps. °· You have a fever. °Get help right away if: °· You have very bad cramps in your back or belly (abdomen). °· You pass large clots or a lot of tissue from your vagina. °· Your bleeding gets worse. °· You feel light-headed. °· You feel weak. °· You pass out (faint). °· You have chills. °· You are leaking fluid from your vagina. °· You have a gush of fluid from your vagina. °Summary °· Sometimes vaginal bleeding during pregnancy is normal and does not cause problems. At other times, bleeding may be a sign of something serious. °· Tell your doctor about any bleeding from your vagina right away. °· Follow your doctor's instructions about how active you can be. You may need someone to help you with your normal activities. °This information is not intended to replace advice given to  you by your health care provider. Make sure you discuss any questions you have with your health care provider. °Document Released: 06/20/2013 Document Revised: 05/25/2018 Document Reviewed: 05/07/2016 °Elsevier Patient Education © 2020 [employer] ° °

## 2018-11-12 NOTE — MAU Note (Deleted)
4 +HPT.  Started bleeding today. Started light/pink, has gotten heavier. Hasn't changed. No pain.

## 2018-11-14 ENCOUNTER — Other Ambulatory Visit: Payer: Self-pay | Admitting: Certified Nurse Midwife

## 2018-11-16 LAB — CERVICOVAGINAL ANCILLARY ONLY
Chlamydia: NEGATIVE
Neisseria Gonorrhea: NEGATIVE

## 2018-11-25 ENCOUNTER — Other Ambulatory Visit: Payer: Self-pay

## 2018-11-25 ENCOUNTER — Ambulatory Visit: Payer: Managed Care, Other (non HMO) | Admitting: *Deleted

## 2018-11-25 DIAGNOSIS — O0990 Supervision of high risk pregnancy, unspecified, unspecified trimester: Secondary | ICD-10-CM

## 2018-11-25 DIAGNOSIS — O24119 Pre-existing diabetes mellitus, type 2, in pregnancy, unspecified trimester: Secondary | ICD-10-CM | POA: Insufficient documentation

## 2018-11-25 DIAGNOSIS — Z8639 Personal history of other endocrine, nutritional and metabolic disease: Secondary | ICD-10-CM

## 2018-11-25 DIAGNOSIS — O24919 Unspecified diabetes mellitus in pregnancy, unspecified trimester: Secondary | ICD-10-CM | POA: Insufficient documentation

## 2018-11-25 DIAGNOSIS — O169 Unspecified maternal hypertension, unspecified trimester: Secondary | ICD-10-CM

## 2018-11-25 HISTORY — DX: Supervision of high risk pregnancy, unspecified, unspecified trimester: O09.90

## 2018-11-25 HISTORY — DX: Unspecified maternal hypertension, unspecified trimester: O16.9

## 2018-11-25 NOTE — Progress Notes (Signed)
I connected with  Sherie Don on 11/25/18 at  8:30 AM EDT by telephone and verified that I am speaking with the correct person using two identifiers.   I discussed the limitations, risks, security and privacy concerns of performing an evaluation and management service by telephone and the availability of in person appointments. I also discussed with the patient that there may be a patient responsible charge related to this service. The patient expressed understanding and agreed to proceed.  Linda,RN 11/25/2018  8:35 AM Explained I am completing her New OB Intake today. We discussed Her EDD and that it is based on  sure LMP . I reviewed her allergies, meds, OB History, Medical /Surgical history, and appropriate screenings. I explained we will send her [employer] app- app sent to her while on phone.  She confirmed she has a blood pressure cuff and knows how to use it.  Explained  then we will have her take her blood pressure weekly and enter into the app. Explained she will have some visits in office and some virtually. She already has Community education officer. Reviewed appointment date/ time with her , our location and to wear mask, no visitors. Explained she will have exam, ob bloodwork, hemoglobin a1C,  , genetic testing if desired, pap if needed. I  scheduled an [place] at 19 weeks and gave her the appointment. She voices understanding.  Vaughan Basta, [place]

## 2018-12-03 ENCOUNTER — Encounter: Payer: Self-pay | Admitting: *Deleted

## 2018-12-08 ENCOUNTER — Encounter: Payer: Self-pay | Admitting: Family Medicine

## 2018-12-08 ENCOUNTER — Other Ambulatory Visit: Payer: Self-pay

## 2018-12-08 ENCOUNTER — Ambulatory Visit (INDEPENDENT_AMBULATORY_CARE_PROVIDER_SITE_OTHER): Payer: Managed Care, Other (non HMO) | Admitting: Family Medicine

## 2018-12-08 VITALS — BP 147/107 | HR 96 | Wt 274.5 lb

## 2018-12-08 DIAGNOSIS — Z124 Encounter for screening for malignant neoplasm of cervix: Secondary | ICD-10-CM | POA: Diagnosis not present

## 2018-12-08 DIAGNOSIS — E113393 Type 2 diabetes mellitus with moderate nonproliferative diabetic retinopathy without macular edema, bilateral: Secondary | ICD-10-CM

## 2018-12-08 DIAGNOSIS — Z1151 Encounter for screening for human papillomavirus (HPV): Secondary | ICD-10-CM

## 2018-12-08 DIAGNOSIS — O0991 Supervision of high risk pregnancy, unspecified, first trimester: Secondary | ICD-10-CM

## 2018-12-08 DIAGNOSIS — O0990 Supervision of high risk pregnancy, unspecified, unspecified trimester: Secondary | ICD-10-CM

## 2018-12-08 DIAGNOSIS — Z3A12 12 weeks gestation of pregnancy: Secondary | ICD-10-CM | POA: Diagnosis not present

## 2018-12-08 DIAGNOSIS — O162 Unspecified maternal hypertension, second trimester: Secondary | ICD-10-CM | POA: Diagnosis not present

## 2018-12-08 DIAGNOSIS — O24119 Pre-existing diabetes mellitus, type 2, in pregnancy, unspecified trimester: Secondary | ICD-10-CM | POA: Diagnosis not present

## 2018-12-08 DIAGNOSIS — Z23 Encounter for immunization: Secondary | ICD-10-CM | POA: Diagnosis not present

## 2018-12-08 DIAGNOSIS — Z8639 Personal history of other endocrine, nutritional and metabolic disease: Secondary | ICD-10-CM

## 2018-12-08 MED ORDER — ASPIRIN 81 MG PO CHEW: 81.0000 mg | CHEWABLE_TABLET | Freq: Every day | ORAL | 3 refills | Status: DC

## 2018-12-08 MED ORDER — BLOOD PRESSURE KIT DEVI: 1.0000 | Freq: Once | 0 refills | Status: AC

## 2018-12-08 NOTE — Progress Notes (Signed)
Lantus 87-121     Subjective:   Brenda Casey is a 33 y.o. G3P0020 at 108w1d by early ultrasound being seen today for her first obstetrical visit.  Her obstetrical history is significant for obesity and HTN, DM with retinopathy, hypothyroid.Pregnancy history fully reviewed.  Patient reports nausea.  HISTORY: OB History  Gravida Para Term Preterm AB Living  3 0 0 0 2 0  SAB TAB Ectopic Multiple Live Births  2 0 0 0 0    # Outcome Date GA Lbr Len/2nd Weight Sex Delivery Anes PTL Lv  3 Current           2 SAB 01/2018 [redacted]w[redacted]d            Birth Comments: 72 w by lmp but [place] showed demise at [redacted]w[redacted]d  1 SAB 09/2016 [redacted]w[redacted]d          Last pap smear was  Long ago and was normal Past Medical History:  Diagnosis Date  . Diabetes mellitus without complication (HCC)   . Essential hypertension   . Hypothyroidism   . Morbid obesity (HCC) 07/30/2018   Last Assessment & Plan:  Uncontrolled patient and I discussed lifestyle and dietary measures to lower BMI, will continue to monitor.  . Obesity   . Retinopathy   . Thyroid disease    No past surgical history on file. Family History  Problem Relation Age of Onset  . Diabetes Mother   . Hypertension Mother   . Breast cancer Mother   . Diabetes Father   . Hypertension Father   . Breast cancer Maternal Grandmother    Social History   Tobacco Use  . Smoking status: Never Smoker  . Smokeless tobacco: Never Used  Substance Use Topics  . Alcohol use: No  . Drug use: No   Allergies  Allergen Reactions  . Monosodium Glutamate Hives  . Dairy Aid [employer]  . Other Hives    MSG  . Soy Allergy Hives  . Lac Bovis Diarrhea   Current Outpatient Medications on File Prior to Visit  Medication Sig Dispense Refill  . ACCU-CHEK GUIDE test strip PLEASE CHECK BLOOD SUGAR THREE TIMES DAILY.    . BD PEN NEEDLE NANO U/F 32G X 4 MM MISC USE WITH INSULIN PEN AS DIRECTED    . Insulin Glargine (BASAGLAR KWIKPEN) 100 UNIT/ML SOPN INJECT 0.1 MLS (10  UNITS DOSE) INTO THE SKIN DAILY.    Marland Kitchen insulin glargine (LANTUS) 100 UNIT/ML injection Inject 10 Units into the skin daily.    Marland Kitchen labetalol (NORMODYNE) 200 MG tablet Take 1 tablet (200 mg total) by mouth 2 (two) times daily. 60 tablet 1  . Lancets (ACCU-CHEK MULTICLIX) lancets Please check blood sugar three times daily.    . Prenatal Vit-Fe Fumarate-FA (MULTIVITAMIN-PRENATAL) 27-0.8 MG TABS tablet Take 1 tablet by mouth daily at 12 noon.    Marland Kitchen levothyroxine (SYNTHROID, LEVOTHROID) 25 MCG tablet Take 25 mcg by mouth daily before breakfast.     No current facility-administered medications on file prior to visit.      Exam   Vitals:   12/08/18 1037 12/08/18 1102  BP: (!) 157/87 (!) 147/107  Pulse: 96 96  Weight: 274 lb 8 oz (124.5 kg)       Uterus:   limited by body habitus  Pelvic Exam: Perineum: no hemorrhoids, normal perineum   Vulva: normal external genitalia, no lesions   Vagina:  normal mucosa, normal discharge   Cervix: no lesions and normal, pap smear done.  Adnexa: normal adnexa and no mass, fullness, tenderness   Bony Pelvis: average  System: General: well-developed, well-nourished female in no acute distress   Breast:  normal appearance, no masses or tenderness   Skin: normal coloration and turgor, no rashes   Neurologic: oriented, normal, negative, normal mood   Extremities: normal strength, tone, and muscle mass, ROM of all joints is normal   HEENT PERRLA, extraocular movement intact and sclera clear, anicteric   Mouth/Teeth mucous membranes moist, pharynx normal without lesions and dental hygiene good   Neck supple and no masses   Cardiovascular: regular rate and rhythm   Respiratory:  no respiratory distress, normal breath sounds   Abdomen: soft, non-tender; bowel sounds normal; no masses,  no organomegaly    Limited OB u/s +flicker and + movement Assessment:   Pregnancy: [id number] Patient Active Problem List   Diagnosis Date Noted  . Supervision of high risk  pregnancy, antepartum 11/25/2018  . Hypertension affecting pregnancy 11/25/2018  . Type 2 diabetes mellitus affecting pregnancy, antepartum 11/25/2018  . 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 Essential hypertension 07/30/2018  . History of hypothyroidism 07/30/2018  . Morbid obesity ([place]) 07/30/2018  . Type 2 diabetes mellitus without complication, without long-term current use of insulin ([place]) 07/30/2018     Plan:  1. Supervision of high risk pregnancy, antepartum New OB labs - Culture, OB Urine - Genetic Screening - Obstetric Panel, Including HIV - Cytology - PAP( [place])  2. Hypertension affecting pregnancy in second trimester - Protein / creatinine ratio, urine - Comprehensive metabolic panel - aspirin 81 MG chewable tablet; Chew 1 tablet (81 mg total) by mouth daily.  Dispense: 90 tablet; Refill: 3  3. Type 2 diabetes mellitus affecting pregnancy, antepartum Reviewed diet and needs to see D and N mgmt, Consider Omnipod Last A1C was 10 in 10/2018--Has known retinopathy--risks reviewed. - Hemoglobin A1c - Comprehensive metabolic panel - aspirin 81 MG chewable tablet; Chew 1 tablet (81 mg total) by mouth daily.  Dispense: 90 tablet; Refill: 3  4. History of hypothyroidism Importance of this reviewed May need multiple checks throughout pregnancy - TSH   Initial labs drawn. Continue prenatal vitamins. Genetic Screening discussed, NIPS: ordered. Ultrasound discussed; fetal anatomic survey: ordered. Problem list reviewed and updated. Routine obstetric precautions reviewed. Return in about 2 weeks (around 12/22/2018) for referral Diabetes and Nutrition, [facility], in person, needs MD.

## 2018-12-08 NOTE — Patient Instructions (Signed)
Following an appropriate diet and keeping your blood sugar under control is the most important thing to do for your health and that of your unborn baby.  Please check your blood sugar 4 times daily.  Please keep accurate BS logs and bring them with you to every visit.  Please bring your meter also.  Goals for Blood sugar should be: 1. Fasting (first thing in the morning before eating) should be less than 90.   2.  2 hours after meals should be less than 120.  Please eat 3 meals and 3 snacks.  Include protein (meat, dairy-cheese, eggs, nuts) with all meals.  Be mindful that carbohydrates increase your blood sugar.  Not just sweet food (cookies, cake, donuts, fruit, juice, soda) but also bread, pasta, rice, and potatoes.  You have to limit how many carbs you are eating.  Adding exercise, as little as 30 minutes a day can decrease your blood sugar.  

## 2018-12-09 LAB — COMPREHENSIVE METABOLIC PANEL
ALT: 16 IU/L (ref 0–32)
AST: 19 IU/L (ref 0–40)
Albumin/Globulin Ratio: 1.2 (ref 1.2–2.2)
Albumin: 3.8 g/dL (ref 3.8–4.8)
Alkaline Phosphatase: 89 IU/L (ref 39–117)
BUN/Creatinine Ratio: 12 (ref 9–23)
BUN: 9 mg/dL (ref 6–20)
Bilirubin Total: 0.3 mg/dL (ref 0.0–1.2)
CO2: 21 mmol/L (ref 20–29)
Calcium: 9.2 mg/dL (ref 8.7–10.2)
Chloride: 98 mmol/L (ref 96–106)
Creatinine, Ser: 0.74 mg/dL (ref 0.57–1.00)
GFR calc Af Amer: 124 mL/min/{1.73_m2} (ref >59)
GFR calc non Af Amer: 108 mL/min/{1.73_m2} (ref >59)
Globulin, Total: 3.3 g/dL (ref 1.5–4.5)
Glucose: 244 mg/dL — ABNORMAL HIGH (ref 65–99)
Potassium: 4.4 mmol/L (ref 3.5–5.2)
Sodium: 134 mmol/L (ref 134–144)
Total Protein: 7.1 g/dL (ref 6.0–8.5)

## 2018-12-09 LAB — OBSTETRIC PANEL, INCLUDING HIV
Antibody Screen: NEGATIVE
Basophils Absolute: 0 10*3/uL (ref 0.0–0.2)
Basos: 0 %
EOS (ABSOLUTE): 0.1 10*3/uL (ref 0.0–0.4)
Eos: 1 %
HIV Screen 4th Generation wRfx: NONREACTIVE
Hematocrit: 42.1 % (ref 34.0–46.6)
Hemoglobin: 13.7 g/dL (ref 11.1–15.9)
Hepatitis B Surface Ag: NEGATIVE
Immature Grans (Abs): 0 10*3/uL (ref 0.0–0.1)
Immature Granulocytes: 0 %
Lymphocytes Absolute: 2.3 10*3/uL (ref 0.7–3.1)
Lymphs: 19 %
MCH: 26.6 pg (ref 26.6–33.0)
MCHC: 32.5 g/dL (ref 31.5–35.7)
MCV: 82 fL (ref 79–97)
Monocytes Absolute: 0.5 10*3/uL (ref 0.1–0.9)
Monocytes: 4 %
Neutrophils Absolute: 8.9 10*3/uL — ABNORMAL HIGH (ref 1.4–7.0)
Neutrophils: 76 %
Platelets: 341 10*3/uL (ref 150–450)
RBC: 5.16 x10E6/uL (ref 3.77–5.28)
RDW: 13.5 % (ref 11.7–15.4)
RPR Ser Ql: NONREACTIVE
Rh Factor: POSITIVE
Rubella Antibodies, IGG: 3.46 index (ref >0.99)
WBC: 11.8 10*3/uL — ABNORMAL HIGH (ref 3.4–10.8)

## 2018-12-09 LAB — PROTEIN / CREATININE RATIO, URINE
Creatinine, Urine: 237.9 mg/dL
Protein, Ur: 19.4 mg/dL
Protein/Creat Ratio: 82 mg/g creat (ref 0–200)

## 2018-12-09 LAB — HEMOGLOBIN A1C
Est. average glucose Bld gHb Est-mCnc: 258 mg/dL
Hgb A1c MFr Bld: 10.6 % — ABNORMAL HIGH (ref 4.8–5.6)

## 2018-12-09 LAB — TSH: TSH: 1.64 u[IU]/mL (ref 0.450–4.500)

## 2018-12-10 LAB — CULTURE, OB URINE

## 2018-12-10 LAB — URINE CULTURE, OB REFLEX: Organism ID, Bacteria: NO GROWTH

## 2018-12-13 ENCOUNTER — Encounter: Payer: Self-pay | Admitting: Family Medicine

## 2018-12-13 DIAGNOSIS — R87810 Cervical high risk human papillomavirus (HPV) DNA test positive: Secondary | ICD-10-CM | POA: Insufficient documentation

## 2018-12-13 LAB — CYTOLOGY - PAP
Adequacy: ABSENT
Comment: NEGATIVE
Comment: NEGATIVE
Comment: NEGATIVE
Diagnosis: NEGATIVE
HPV 16: NEGATIVE
HPV 18 / 45: NEGATIVE
High risk HPV: POSITIVE — AB

## 2018-12-14 ENCOUNTER — Encounter: Payer: Self-pay | Admitting: General Practice

## 2018-12-16 ENCOUNTER — Encounter: Payer: Self-pay | Admitting: General Practice

## 2018-12-16 ENCOUNTER — Other Ambulatory Visit: Payer: Self-pay | Admitting: Certified Nurse Midwife

## 2018-12-16 DIAGNOSIS — O162 Unspecified maternal hypertension, second trimester: Secondary | ICD-10-CM

## 2018-12-16 DIAGNOSIS — O0990 Supervision of high risk pregnancy, unspecified, unspecified trimester: Secondary | ICD-10-CM

## 2018-12-16 DIAGNOSIS — O24119 Pre-existing diabetes mellitus, type 2, in pregnancy, unspecified trimester: Secondary | ICD-10-CM

## 2018-12-23 ENCOUNTER — Encounter: Payer: Self-pay | Admitting: Obstetrics and Gynecology

## 2018-12-23 ENCOUNTER — Ambulatory Visit (INDEPENDENT_AMBULATORY_CARE_PROVIDER_SITE_OTHER): Payer: Managed Care, Other (non HMO) | Admitting: Obstetrics and Gynecology

## 2018-12-23 ENCOUNTER — Encounter: Payer: Self-pay | Admitting: *Deleted

## 2018-12-23 ENCOUNTER — Other Ambulatory Visit: Payer: Self-pay

## 2018-12-23 ENCOUNTER — Telehealth: Payer: Self-pay

## 2018-12-23 ENCOUNTER — Ambulatory Visit: Payer: Managed Care, Other (non HMO) | Admitting: *Deleted

## 2018-12-23 ENCOUNTER — Encounter: Payer: Managed Care, Other (non HMO) | Attending: Obstetrics and Gynecology | Admitting: *Deleted

## 2018-12-23 VITALS — BP 170/108 | HR 92 | Wt 276.9 lb

## 2018-12-23 DIAGNOSIS — O24119 Pre-existing diabetes mellitus, type 2, in pregnancy, unspecified trimester: Secondary | ICD-10-CM

## 2018-12-23 DIAGNOSIS — Z8639 Personal history of other endocrine, nutritional and metabolic disease: Secondary | ICD-10-CM

## 2018-12-23 DIAGNOSIS — Z713 Dietary counseling and surveillance: Secondary | ICD-10-CM | POA: Diagnosis present

## 2018-12-23 DIAGNOSIS — O24419 Gestational diabetes mellitus in pregnancy, unspecified control: Secondary | ICD-10-CM | POA: Insufficient documentation

## 2018-12-23 DIAGNOSIS — O24112 Pre-existing diabetes mellitus, type 2, in pregnancy, second trimester: Secondary | ICD-10-CM

## 2018-12-23 DIAGNOSIS — E113393 Type 2 diabetes mellitus with moderate nonproliferative diabetic retinopathy without macular edema, bilateral: Secondary | ICD-10-CM

## 2018-12-23 DIAGNOSIS — O99212 Obesity complicating pregnancy, second trimester: Secondary | ICD-10-CM

## 2018-12-23 DIAGNOSIS — O0992 Supervision of high risk pregnancy, unspecified, second trimester: Secondary | ICD-10-CM

## 2018-12-23 DIAGNOSIS — O0990 Supervision of high risk pregnancy, unspecified, unspecified trimester: Secondary | ICD-10-CM

## 2018-12-23 DIAGNOSIS — Z3A14 14 weeks gestation of pregnancy: Secondary | ICD-10-CM

## 2018-12-23 DIAGNOSIS — O162 Unspecified maternal hypertension, second trimester: Secondary | ICD-10-CM

## 2018-12-23 MED ORDER — BASAGLAR KWIKPEN 100 UNIT/ML ~~LOC~~ SOPN: 15.0000 [IU] | PEN_INJECTOR | Freq: Every day | SUBCUTANEOUS | 0 refills | Status: DC

## 2018-12-23 NOTE — Progress Notes (Signed)
   PRENATAL VISIT NOTE  Subjective:  Brenda Casey is a 33 y.o. G3P0020 at [redacted]w[redacted]d being seen today for ongoing prenatal care.  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  Patient reports no complaints. Concerned about here elevated BP.  Contractions: Not present. Vag. Bleeding: None.  Movement: Absent. Denies leaking of fluid.   The following portions of the patient's history were reviewed and updated as appropriate: allergies, current medications, past family history, past medical history, past social history, past surgical history and problem list.   Objective:   Vitals:   12/23/18 0950 12/23/18 0952  BP: (!) 163/100 (!) 170/108  Pulse: 91 92  Weight: 276 lb 14.4 oz (125.6 kg)     Fetal Status: Fetal Heart Rate (bpm): 165   Movement: Absent     General:  Alert, oriented and cooperative. Patient is in no acute distress.  Skin: Skin is warm and dry. No rash noted.   Cardiovascular: Normal heart rate noted  Respiratory: Normal respiratory effort, no problems with respiration noted  Abdomen: Soft, gravid, appropriate for gestational age.  Pain/Pressure: Absent     Pelvic: Cervical exam deferred        Extremities: Normal range of motion.  Edema: None  Mental Status: Normal mood and affect. Normal behavior. Normal judgment and thought content.   Assessment and Plan:  Pregnancy: [id number] at [redacted]w[redacted]d  1. Supervision of high risk pregnancy,  antepartum Reviewed panorama, she would like to redraw today Will plan for AFP next visit as well  2. History of hypothyroidism On synthroid 25 mcg  3. Morbid obesity ([place])  4. Type 2 diabetes mellitus affecting pregnancy, antepartum Cont baby ASA On glargine 10 units daily Saw diabetes today FG: 90-121 PP: 140s mostly, up to 165 Will increase glargine to 15 units, reassess and then plan to add meal time insulin in 1-2 weeks (plan made with Brenda Casey)  5. Moderate nonproliferative diabetic retinopathy of both eyes without macular edema associated with type 2 diabetes mellitus ([place])  6. Hypertension affecting pregnancy in second trimester On labetalol 200 mg BID Severe range BP, will double to 400 mg BID Start putting BP into babyscripts, will increase prn   Preterm labor symptoms and general obstetric precautions including but not limited to vaginal bleeding, contractions, leaking of fluid and fetal movement were reviewed in detail with the patient. Please refer to After Visit Summary for other counseling recommendations.   Return in about 2 weeks (around 01/06/2019) for high OB, in person, AFP.  Future Appointments  Date Time Provider [place]  01/25/2019  8:15 AM Reola Calkins [place]  01/25/2019 10:00 AM [place] NURSE [place] [facility]  01/25/2019 10:00 AM [place] 3 [facility]    Sloan Leiter, MD

## 2018-12-23 NOTE — Progress Notes (Signed)
Pt has paper logs, did not bring today. Also has been experiencing some nausea.

## 2018-12-23 NOTE — Progress Notes (Signed)
Patient was seen on 12/23/2018 for type 2 Diabetes and pregnancy self-management. EDD 06/21/2019 (currently @ [redacted]W[redacted]D). Patient states history of this diabetes for this past year.  She started on Metformin but it was too hard on her stomach. In September she went back for follow up visit and discovered she was pregnant and the Metformin was stopped. She is now on Basaglar @ 10 units daily, she reports  taking it about 930 AM every day. Diet history obtained. Patient eats good variety of all food groups but is having some nausea and she is limited in variety. Beverages include water, some Gatorade, occasionally juice but no soda.  She does not eat beef or pork, only chicken, [place] or fish. She states no training on Carbohydrate Counting.  The following learning objectives were met by the patient :   States the definition of type 2 Diabetes and pregnancy   States why dietary management is important in controlling blood glucose  Describes the effects of carbohydrates on blood glucose levels  Demonstrates ability to create a balanced meal plan  Demonstrates carbohydrate counting   States when to check blood glucose levels  Demonstrates proper blood glucose monitoring techniques  States the effect of stress and exercise on blood glucose levels  States the importance of limiting caffeine and abstaining from alcohol and smoking  Plan:   Aim for 3 Carb Choices per meal (45 grams) +/- 1 either way   Aim for 1-2 Carbs per snack  Begin reading food labels for Total Carbohydrate of foods  Consider  increasing your activity level by walking or other activity daily as tolerated  Begin checking BG before breakfast and 2 hours after first bite of breakfast, lunch and dinner as directed by MD   Bring Log Book/Sheet and meter to every medical appointment OR use Baby Scripts (see below)  Patient was introduced to [employer], she plans to use as record of BG electronically   Take medication as  directed by MD  Patient already has a meter: Accu Chek Guide And is testing pre breakfast and 2 hours each meal as directed by MD Patient reports: FBG: 98-121 mg/dl and post meals @ 137-164 mg/dl  She is seeing Dr. Fulton Reek after this appointment who will evaluate insulin dose based on BG readings.   Patient instructed to monitor glucose levels: FBS: 60 - 95 mg/dl 2 hour: <120 mg/dl  Patient received the following handouts:  Nutrition Diabetes and Pregnancy  Carbohydrate Counting List  Patient will be seen for follow-up in 1 month

## 2018-12-23 NOTE — Telephone Encounter (Signed)
Called pt to advise of increase of Insulin to 15 units & to make sure to log readings in BRx before meal time Insulin is added. Pt verbalized understanding.

## 2018-12-23 NOTE — Telephone Encounter (Signed)
-----   Message from Sloan Leiter, MD sent at 12/23/2018  1:51 PM EST ----- Please call patient and let her know I spoke to [facility], we want her to increase her daily dose to 15 units. We will potentially add meal time insulin in the next week or two but want to see her CBGs in baby scripts with this new dose before we do that. I have sent the new prescription to the pharmacy and to have her call if she haas any issues or questions. Thanks.

## 2018-12-29 ENCOUNTER — Telehealth: Payer: Self-pay

## 2018-12-29 NOTE — Telephone Encounter (Signed)
Received BRx BP alert, stating pt  bp was 135/93,talked with Dr. Roselie Awkward & he said since pt has an appt coming up on 11/23 with Dr. Nehemiah Settle to follow up then & continue her meds as normal. Called pt to advise, no answer, left VM.

## 2018-12-31 ENCOUNTER — Encounter: Payer: Self-pay | Admitting: Family Medicine

## 2019-01-03 ENCOUNTER — Encounter: Payer: Self-pay | Admitting: *Deleted

## 2019-01-03 ENCOUNTER — Telehealth: Payer: Self-pay | Admitting: Lactation Services

## 2019-01-03 NOTE — Telephone Encounter (Signed)
Attempted to call pt to give results of [place] screening. Pt did not answer. Sent pt My Chart message. Advised pt to read My Chart Message and to call if she has any further questions or concerns.

## 2019-01-04 ENCOUNTER — Telehealth (INDEPENDENT_AMBULATORY_CARE_PROVIDER_SITE_OTHER): Payer: Managed Care, Other (non HMO) | Admitting: Lactation Services

## 2019-01-04 DIAGNOSIS — O0990 Supervision of high risk pregnancy, unspecified, unspecified trimester: Secondary | ICD-10-CM

## 2019-01-04 NOTE — Telephone Encounter (Signed)
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 Pt was advised to call [facility] to schedule a Genetic Counseling screening at [phone] for explanation and recommended follow up.   Pt asked about redraw on her Panorama. She was informed that her redraw again showed insufficient DNA. Pt was given phone # for Panorama to speak with Dietitian. Pt. Indicated she does not want it repeated at this time. Pt to let us know if she changes her mind at her next appt.   Pt with [place] on 12/08. Pt with no other questions or concerns at this time.

## 2019-01-05 ENCOUNTER — Other Ambulatory Visit: Payer: Self-pay | Admitting: *Deleted

## 2019-01-05 DIAGNOSIS — O24419 Gestational diabetes mellitus in pregnancy, unspecified control: Secondary | ICD-10-CM

## 2019-01-10 ENCOUNTER — Encounter: Payer: Self-pay | Admitting: *Deleted

## 2019-01-10 ENCOUNTER — Ambulatory Visit (INDEPENDENT_AMBULATORY_CARE_PROVIDER_SITE_OTHER): Payer: Managed Care, Other (non HMO) | Admitting: Family Medicine

## 2019-01-10 ENCOUNTER — Other Ambulatory Visit: Payer: Self-pay

## 2019-01-10 VITALS — BP 151/98 | HR 88 | Temp 98.9°F | Wt 282.0 lb

## 2019-01-10 DIAGNOSIS — O0992 Supervision of high risk pregnancy, unspecified, second trimester: Secondary | ICD-10-CM

## 2019-01-10 DIAGNOSIS — O0990 Supervision of high risk pregnancy, unspecified, unspecified trimester: Secondary | ICD-10-CM

## 2019-01-10 DIAGNOSIS — O24119 Pre-existing diabetes mellitus, type 2, in pregnancy, unspecified trimester: Secondary | ICD-10-CM

## 2019-01-10 DIAGNOSIS — Z3A16 16 weeks gestation of pregnancy: Secondary | ICD-10-CM

## 2019-01-10 DIAGNOSIS — E113393 Type 2 diabetes mellitus with moderate nonproliferative diabetic retinopathy without macular edema, bilateral: Secondary | ICD-10-CM

## 2019-01-10 DIAGNOSIS — O162 Unspecified maternal hypertension, second trimester: Secondary | ICD-10-CM

## 2019-01-10 DIAGNOSIS — Z8639 Personal history of other endocrine, nutritional and metabolic disease: Secondary | ICD-10-CM

## 2019-01-10 DIAGNOSIS — O24112 Pre-existing diabetes mellitus, type 2, in pregnancy, second trimester: Secondary | ICD-10-CM

## 2019-01-10 MED ORDER — LABETALOL HCL 200 MG PO TABS: 400.0000 mg | ORAL_TABLET | Freq: Three times a day (TID) | ORAL | 3 refills | Status: DC

## 2019-01-10 NOTE — Progress Notes (Signed)
Subjective:  Brenda Casey is a 33 y.o. G3P0020 at [redacted]w[redacted]d being seen today for ongoing prenatal care.  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  GDM: Patient taking lantus 15 units qAM.  Reports no hypoglycemic episodes.  Tolerating medication well Checks CBGs consistently, but doesn't enter them all into babyscripts. Prefers to write them down. Fasting: only 1 above 95 2hr PP: 5 elevated.   Patient reports no complaints.  Contractions: Not present. Vag. Bleeding: None.  Movement: Absent. Denies leaking of fluid.   The following portions of the patient's history were reviewed and updated as appropriate: allergies, current medications, past family history, past medical history, past social history, past surgical history and problem list. Problem list updated.  Objective:   Vitals:   01/10/19 1628  BP: (!) 151/98  Pulse: 88  Temp: 98.9 F (37.2 C)  Weight: 282 lb (127.9 kg)    Fetal Status: Fetal Heart Rate (bpm): 155   Movement: Absent     General:  Alert, oriented and cooperative. Patient is in no acute distress.  Skin: Skin is warm and dry. No rash noted.   Cardiovascular: Normal heart rate noted  Respiratory: Normal respiratory effort, no problems with respiration noted  Abdomen: Soft, gravid, appropriate for gestational age. Pain/Pressure: Absent     Pelvic: Vag. Bleeding: None     Cervical exam deferred        Extremities: Normal  range of motion.     Mental Status: Normal mood and affect. Normal behavior. Normal judgment and thought content.   Urinalysis:      Assessment and Plan:  Pregnancy: G3P0020 at [redacted]w[redacted]d  1. Supervision of high risk pregnancy, antepartum FHT normal  2. History of hypothyroidism Normal TSH in October. Survellience every trimester  3. Type 2 diabetes mellitus affecting pregnancy, antepartum Continue lantus 15units ASA 81mg  Schedule fetal echo Patient to write CBGs on paper for more consistent data  4. Moderate nonproliferative diabetic retinopathy of both eyes without macular edema associated with type 2 diabetes mellitus ([place])  5. Hypertension affecting pregnancy in second trimester Increase labetalol to 400mg  TID  Preterm labor symptoms and general obstetric precautions including but not limited to vaginal bleeding, contractions, leaking of fluid and fetal movement were reviewed in detail with the patient. Please refer to After Visit Summary for other counseling recommendations.  No follow-ups on file.   Truett Mainland, DO

## 2019-01-20 ENCOUNTER — Encounter: Payer: Self-pay | Admitting: *Deleted

## 2019-01-24 ENCOUNTER — Encounter: Payer: Self-pay | Admitting: Obstetrics & Gynecology

## 2019-01-24 ENCOUNTER — Encounter: Payer: Managed Care, Other (non HMO) | Admitting: Obstetrics & Gynecology

## 2019-01-24 ENCOUNTER — Telehealth: Payer: Self-pay | Admitting: Obstetrics & Gynecology

## 2019-01-24 NOTE — Telephone Encounter (Signed)
attempted to contact patient to get her rescheduled for her missed ob appointment. No answer, left voicemail for patient to give the office a call back to be rescheduled. No show letter mailed

## 2019-01-25 ENCOUNTER — Other Ambulatory Visit: Payer: Self-pay

## 2019-01-25 ENCOUNTER — Encounter (HOSPITAL_COMMUNITY): Payer: Self-pay

## 2019-01-25 ENCOUNTER — Other Ambulatory Visit: Payer: Self-pay | Admitting: Family Medicine

## 2019-01-25 ENCOUNTER — Ambulatory Visit (HOSPITAL_COMMUNITY)
Admission: RE | Admit: 2019-01-25 | Discharge: 2019-01-25 | Disposition: A | Discharge status: Home / Self Care | Payer: Managed Care, Other (non HMO) | Source: Ambulatory Visit | Attending: Obstetrics and Gynecology | Admitting: Obstetrics and Gynecology

## 2019-01-25 ENCOUNTER — Ambulatory Visit: Payer: Managed Care, Other (non HMO) | Admitting: *Deleted

## 2019-01-25 ENCOUNTER — Encounter: Payer: Self-pay | Admitting: Family Medicine

## 2019-01-25 ENCOUNTER — Ambulatory Visit (HOSPITAL_COMMUNITY): Payer: Managed Care, Other (non HMO) | Admitting: *Deleted

## 2019-01-25 ENCOUNTER — Encounter: Payer: Managed Care, Other (non HMO) | Attending: Obstetrics and Gynecology | Admitting: *Deleted

## 2019-01-25 DIAGNOSIS — E039 Hypothyroidism, unspecified: Secondary | ICD-10-CM

## 2019-01-25 DIAGNOSIS — O10012 Pre-existing essential hypertension complicating pregnancy, second trimester: Secondary | ICD-10-CM | POA: Diagnosis not present

## 2019-01-25 DIAGNOSIS — O24119 Pre-existing diabetes mellitus, type 2, in pregnancy, unspecified trimester: Secondary | ICD-10-CM | POA: Insufficient documentation

## 2019-01-25 DIAGNOSIS — O24419 Gestational diabetes mellitus in pregnancy, unspecified control: Secondary | ICD-10-CM | POA: Insufficient documentation

## 2019-01-25 DIAGNOSIS — O169 Unspecified maternal hypertension, unspecified trimester: Secondary | ICD-10-CM | POA: Diagnosis present

## 2019-01-25 DIAGNOSIS — O0990 Supervision of high risk pregnancy, unspecified, unspecified trimester: Secondary | ICD-10-CM

## 2019-01-25 DIAGNOSIS — Z713 Dietary counseling and surveillance: Secondary | ICD-10-CM | POA: Diagnosis present

## 2019-01-25 DIAGNOSIS — Z3A19 19 weeks gestation of pregnancy: Secondary | ICD-10-CM

## 2019-01-25 DIAGNOSIS — O359XX0 Maternal care for (suspected) fetal abnormality and damage, unspecified, not applicable or unspecified: Secondary | ICD-10-CM | POA: Insufficient documentation

## 2019-01-25 DIAGNOSIS — O24112 Pre-existing diabetes mellitus, type 2, in pregnancy, second trimester: Secondary | ICD-10-CM | POA: Diagnosis not present

## 2019-01-25 DIAGNOSIS — O99282 Endocrine, nutritional and metabolic diseases complicating pregnancy, second trimester: Secondary | ICD-10-CM | POA: Diagnosis not present

## 2019-01-25 DIAGNOSIS — O99212 Obesity complicating pregnancy, second trimester: Secondary | ICD-10-CM

## 2019-01-25 DIAGNOSIS — O162 Unspecified maternal hypertension, second trimester: Secondary | ICD-10-CM

## 2019-01-25 MED ORDER — NIFEDIPINE ER OSMOTIC RELEASE 30 MG PO TB24: 60.0000 mg | ORAL_TABLET | Freq: Two times a day (BID) | ORAL | 2 refills | Status: DC

## 2019-01-25 NOTE — Addendum Note (Signed)
Addended by: Ulice Dash on: 01/25/2019 01:45 PM   Modules accepted: Level of Service

## 2019-01-25 NOTE — Progress Notes (Signed)
Patient was seen on 01/25/2019 for type 2 Diabetes and pregnancy self-management follow up visit. EDD 06/21/2019.  She is now on Basaglar @ 15 units daily, she reports  taking it about 930 AM every day. Diet history obtained. She states she is more comfortable with Carbohydrate Counting and is averaging 3 carbs per meal or less. She states she is recording her blood sugars on log sheet but didn't bring to this visit today. Informed her of Accu Chek APP called MySugr that uploads her numbers directly form the Accu Chek meter to the app and she plans to take a picture of weekly numbers and send via My Chart. She states she is walking about 20 minutes a few days a week.  Plan: continue  Aim for 3 Carb Choices per meal (45 grams) +/- 1 either way   Aim for 1-2 Carbs per snack  Begin reading food labels for Total Carbohydrate of foods  Consider  increasing your activity level by walking or other activity daily as tolerated  Begin checking BG before breakfast and 2 hours after first bite of breakfast, lunch and dinner as directed by MD   Bring Log Book/Sheet and meter to every medical appointment and send picture of blood sugars via My Chart   Take medication as directed by MD  Patient instructed to monitor glucose levels: FBS: 60 - 95 mg/dl 2 hour: <120 mg/dl  Patient received the following handouts:  No new handouts today  Patient will be seen for follow-up in 2 months (after 26 weeks when insulin resistance from placenta typically increases.)

## 2019-01-25 NOTE — Consult Note (Signed)
This patient was seen today due to maternal obesity, history of chronic hypertension currently treated with labetalol 400 mg 3 times a day and pregestational diabetes with diabetic retinopathy.  The patient's hemoglobin A1c at her initial prenatal visit was 10.6%.  Her initial P/C ratio did not indicate significant proteinuria.  During today's exam, the patient's blood pressures were noted to be 167/112. On repeat, it was 161/102.  The patient denies any symptoms related to her elevated blood pressures.  She has had two cell free DNA tests drawn in her current pregnancy which have not showed any results due to a low fetal fraction.  On today's exam, the overall estimated fetal weight measures about 1 week behind her dates. There was normal amniotic fluid noted.  A possible bilateral cleft lip with cleft palate was noted today.  There was also a possible two-vessel umbilical cord noted today.  The views of the fetal anatomy were extremely difficult to obtain today due to extreme maternal body habitus.  The patient was advised we will have to reassess during her future ultrasound exams to determine if a bilateral cleft lip and palate is truly present. The implications and management of a cleft lip/palate was discussed in detail today. The patient was advised that a cleft lip/palate may be repaired after birth usually with good cosmetic results. We will make a referral to the craniofacial team for a prenatal consultation to discuss the repair and management of cleft lip/palate after delivery.  The implications and management of a two-vessel umbilical cord was discussed in detail with the patient today.  She was advised regarding the association of trisomy 18/13 with a two-vessel cord and the possible cleft lip/palate noted today.   Due to the possible cleft lip and palate along with a two-vessel umbilical cord noted today and the smaller fetal size, the patient was offered and declined an amniocentesis for  definitive diagnosis of fetal aneuploidy.  She was also advised that she may have another redraw of her cell free DNA test to screen for fetal aneuploidy.  The patient declined any further testing as an aneuploid fetus would not change the outcome or her decision regarding the management of her pregnancy.  I discussed her extremely elevated blood pressures today with Dr. Nehemiah Settle.  He will add twice daily Procardia in addition to her labetalol to help with her blood pressure control.  The implications and management of chronic hypertension in pregnancy was discussed in detail with the patient.  She was advised regarding the increased risk of superimposed preeclampsia and the possibility of fetal growth restriction due to chronic hypertension.  She was advised to continue taking the antihypertensive medications that have been prescribed in order to attain the most optimal obstetrical outcomes.  The implications and management of diabetes in pregnancy was discussed in detail with the patient. She was advised that our goals for her fingerstick values are fasting values of 90-95 or less and two-hour postprandials of 120 or less.  Should her fingerstick values be above these values, her insulin regimen may have to be adjusted to help her achieve better glycemic control. The patient was advised that getting her fingerstick values as close to these goals as possible would provide her with the most optimal obstetrical outcome.  The patient reports that she already has a fetal echocardiogram scheduled in a few weeks.    Due to chronic hypertension and pregestational diabetes, we will continue to follow her with serial growth ultrasounds.  Weekly fetal testing should  be started at around 30 to 32 weeks.  The patient was advised that her significant vascular disease and her extremely elevated blood pressures may place her at extremely high risk for an indicated preterm delivery.  Hopefully, we will be able to get her  to a point in her pregnancy where the baby can be safely delivered.  A total of 30 minutes was spent counseling and coordinating the care for this patient.  Greater than 50% of the time was spent in direct face-to-face contact.

## 2019-01-26 ENCOUNTER — Other Ambulatory Visit (HOSPITAL_COMMUNITY): Payer: Self-pay | Admitting: *Deleted

## 2019-01-26 DIAGNOSIS — O10919 Unspecified pre-existing hypertension complicating pregnancy, unspecified trimester: Secondary | ICD-10-CM

## 2019-02-04 ENCOUNTER — Other Ambulatory Visit: Payer: Self-pay

## 2019-02-07 ENCOUNTER — Telehealth: Payer: Self-pay | Admitting: *Deleted

## 2019-02-07 NOTE — Telephone Encounter (Signed)
Per message from Dr. Kennon Rounds I called Brenda Casey to discuss her blood sugars. I left a message I am calling to check on her and to discuss her blood sugars and will send  A detailed MyChart message- please read message or if you cannot- call our office.  When she calls needs to schedule ob fu, needs to start logging in blood sugars in Babyscripts- see Bev's notes about that she can use accucheck app called MySugr that will upload blood sugars to app or may send picture of blood sugar log to Mychart message each week. Murice Barbar,RN

## 2019-02-15 ENCOUNTER — Ambulatory Visit (HOSPITAL_COMMUNITY): Payer: Medicaid Other | Admitting: *Deleted

## 2019-02-15 ENCOUNTER — Encounter (HOSPITAL_COMMUNITY): Payer: Self-pay

## 2019-02-15 ENCOUNTER — Other Ambulatory Visit: Payer: Self-pay

## 2019-02-15 ENCOUNTER — Ambulatory Visit (HOSPITAL_COMMUNITY)
Admission: RE | Admit: 2019-02-15 | Discharge: 2019-02-15 | Disposition: A | Discharge status: Home / Self Care | Payer: Medicaid Other | Source: Ambulatory Visit | Attending: Obstetrics and Gynecology | Admitting: Obstetrics and Gynecology

## 2019-02-15 DIAGNOSIS — O0990 Supervision of high risk pregnancy, unspecified, unspecified trimester: Secondary | ICD-10-CM

## 2019-02-15 DIAGNOSIS — O24112 Pre-existing diabetes mellitus, type 2, in pregnancy, second trimester: Secondary | ICD-10-CM

## 2019-02-15 DIAGNOSIS — E039 Hypothyroidism, unspecified: Secondary | ICD-10-CM

## 2019-02-15 DIAGNOSIS — O99212 Obesity complicating pregnancy, second trimester: Secondary | ICD-10-CM | POA: Diagnosis not present

## 2019-02-15 DIAGNOSIS — O10012 Pre-existing essential hypertension complicating pregnancy, second trimester: Secondary | ICD-10-CM

## 2019-02-15 DIAGNOSIS — O10919 Unspecified pre-existing hypertension complicating pregnancy, unspecified trimester: Secondary | ICD-10-CM | POA: Insufficient documentation

## 2019-02-15 DIAGNOSIS — O162 Unspecified maternal hypertension, second trimester: Secondary | ICD-10-CM | POA: Insufficient documentation

## 2019-02-15 DIAGNOSIS — O99282 Endocrine, nutritional and metabolic diseases complicating pregnancy, second trimester: Secondary | ICD-10-CM

## 2019-02-15 DIAGNOSIS — O10912 Unspecified pre-existing hypertension complicating pregnancy, second trimester: Secondary | ICD-10-CM

## 2019-02-15 DIAGNOSIS — O24119 Pre-existing diabetes mellitus, type 2, in pregnancy, unspecified trimester: Secondary | ICD-10-CM | POA: Insufficient documentation

## 2019-02-15 DIAGNOSIS — Z3A22 22 weeks gestation of pregnancy: Secondary | ICD-10-CM

## 2019-02-15 DIAGNOSIS — Z362 Encounter for other antenatal screening follow-up: Secondary | ICD-10-CM | POA: Diagnosis not present

## 2019-02-15 DIAGNOSIS — O359XX0 Maternal care for (suspected) fetal abnormality and damage, unspecified, not applicable or unspecified: Secondary | ICD-10-CM

## 2019-02-15 DIAGNOSIS — O365921 Maternal care for other known or suspected poor fetal growth, second trimester, fetus 1: Secondary | ICD-10-CM | POA: Diagnosis not present

## 2019-02-15 NOTE — Consult Note (Signed)
MFM Note  This patient was seen for a follow-up ultrasound due to a history of chronic hypertension, pregestational diabetes, and maternal obesity.  Bilateral cleft lip and palate along with a two-vessel umbilical cord were suspected during her last ultrasound exam.  The patient's blood pressures were also noted to be extremely elevated during her last exam.  Procardia was added to her labetalol regimen during her last exam.    On today's exam, the patient's blood pressures continue to be elevated in the 164/105 range despite treatment with two antihypertensive medications.  She denies any signs or symptoms of preeclampsia.  The patient reports that she had a normal fetal echocardiogram performed by pediatric cardiology a few weeks ago.  The fetal biometry measurements obtained today measure at the 1st percentile for her gestational age, indicating fetal growth restriction.  There was normal amniotic fluid noted today.  Although the views of the fetal anatomy were limited today due to extreme maternal body habitus, it does appear that the fetus has bilateral cleft lip and palate and a two-vessel umbilical cord.  Doppler studies of the umbilical arteries performed today due to fetal growth restriction showed absent end-diastolic flow.  The patient was advised that her extremely elevated blood pressures are probably contributing to placental dysfunction causing fetal growth restriction.  Due to her extremely elevated blood pressures, I recommended that she be evaluated in the MAU so that her blood pressures could be under better control.  The patient was advised that getting her blood pressures under better control may help improve placental function.  The patient stated that she will probably present to the MAU later this evening.  She should also undergo a preeclampsia work-up in the MAU.  The increased risk of a IUFD and an indicated preterm birth due to her extremely elevated blood pressures and  fetal growth restriction was discussed.  She understands that there is not much that can be done at her current gestational age other than to try to control her blood pressures as the fetus is not viable based on her gestational age or the estimated fetal weight.  A follow-up exam was scheduled in 2 weeks.  Should the EFW be 400 g or greater at her next exam, I would consider administering a complete course of antenatal corticosteroids.  The patient understands that there may be a high likelihood that this may not be a successful pregnancy due to today's ultrasound findings.  A total of 15 minutes was spent counseling and coordinating the care for this patient.  Greater than 50% of the time was spent in direct face-to-face contact.

## 2019-02-16 ENCOUNTER — Other Ambulatory Visit: Payer: Self-pay

## 2019-02-16 ENCOUNTER — Telehealth: Payer: Self-pay | Admitting: Obstetrics and Gynecology

## 2019-02-16 NOTE — Telephone Encounter (Signed)
Called the patient to inform of the upcoming appointment per the request of Dr. Kennon Rounds. Informed the patient of an option to come in today at 3:55 however she is scheduled for tomorrow at 8:15. Informed the patient if this time does not work for you please contact our office to reschedule.

## 2019-02-17 ENCOUNTER — Encounter: Payer: Managed Care, Other (non HMO) | Admitting: Family Medicine

## 2019-02-17 MED ORDER — BASAGLAR KWIKPEN 100 UNIT/ML ~~LOC~~ SOPN: 15.0000 [IU] | PEN_INJECTOR | Freq: Every day | SUBCUTANEOUS | 0 refills | Status: DC

## 2019-02-17 NOTE — Progress Notes (Unsigned)
Received call from [facility] regarding BP reding on 02/16/19 of 149+/89 & today of 145/90. Spoke with Dr. Nehemiah Settle, he advised pt should be fine until her next visit on Monday 02/21/2019.Pt will continue to take BP meds.

## 2019-02-21 ENCOUNTER — Ambulatory Visit (INDEPENDENT_AMBULATORY_CARE_PROVIDER_SITE_OTHER): Payer: Medicaid Other | Admitting: Obstetrics & Gynecology

## 2019-02-21 ENCOUNTER — Other Ambulatory Visit: Payer: Self-pay

## 2019-02-21 ENCOUNTER — Encounter: Payer: Self-pay | Admitting: *Deleted

## 2019-02-21 VITALS — BP 169/98 | HR 90 | Wt 139.0 lb

## 2019-02-21 DIAGNOSIS — Z3A36 36 weeks gestation of pregnancy: Secondary | ICD-10-CM

## 2019-02-21 DIAGNOSIS — O162 Unspecified maternal hypertension, second trimester: Secondary | ICD-10-CM

## 2019-02-21 DIAGNOSIS — O163 Unspecified maternal hypertension, third trimester: Secondary | ICD-10-CM

## 2019-02-21 DIAGNOSIS — O24119 Pre-existing diabetes mellitus, type 2, in pregnancy, unspecified trimester: Secondary | ICD-10-CM

## 2019-02-21 DIAGNOSIS — O24113 Pre-existing diabetes mellitus, type 2, in pregnancy, third trimester: Secondary | ICD-10-CM

## 2019-02-21 MED ORDER — LABETALOL HCL 200 MG PO TABS: 800.0000 mg | ORAL_TABLET | Freq: Two times a day (BID) | ORAL | 3 refills | Status: DC

## 2019-02-21 MED ORDER — BD PEN NEEDLE NANO U/F 32G X 4 MM MISC: 2 refills | Status: DC

## 2019-02-21 MED ORDER — BASAGLAR KWIKPEN 100 UNIT/ML ~~LOC~~ SOPN: 15.0000 [IU] | PEN_INJECTOR | Freq: Every day | SUBCUTANEOUS | 2 refills | Status: DC

## 2019-02-21 NOTE — Patient Instructions (Signed)

## 2019-02-24 ENCOUNTER — Other Ambulatory Visit (HOSPITAL_COMMUNITY): Payer: Self-pay | Admitting: *Deleted

## 2019-02-24 DIAGNOSIS — O365990 Maternal care for other known or suspected poor fetal growth, unspecified trimester, not applicable or unspecified: Secondary | ICD-10-CM

## 2019-02-28 ENCOUNTER — Telehealth: Payer: Self-pay | Admitting: *Deleted

## 2019-02-28 ENCOUNTER — Encounter: Payer: Self-pay | Admitting: *Deleted

## 2019-02-28 NOTE — Telephone Encounter (Addendum)
-----   Message from Reva Bores, MD sent at 02/25/2019  1:56 PM EST ----- No appointment until 1/21--no CBGs in BS--can we trouble shoot, so that we can better monitor her sugars.  1/11 0945 Called pt and left message stating that I am following up on her recent prescription and to check on her blood sugar values. I will send a MyChart message and request that she respond with information so that we may provide the best care.   1/13  0915  Per chart review, pt has not yet read the MyChart message sent on 1/11. I called pt and left VM message stating that I am following up on her recent prescription. I stated that I had sent a MyChart message 2 days ago, asked her to check the message and respond.

## 2019-03-03 ENCOUNTER — Encounter (HOSPITAL_COMMUNITY): Payer: Self-pay

## 2019-03-03 ENCOUNTER — Ambulatory Visit (HOSPITAL_COMMUNITY)
Admission: RE | Admit: 2019-03-03 | Discharge: 2019-03-03 | Disposition: A | Discharge status: Home / Self Care | Payer: Medicaid Other | Source: Ambulatory Visit | Attending: Obstetrics and Gynecology | Admitting: Obstetrics and Gynecology

## 2019-03-03 ENCOUNTER — Other Ambulatory Visit (HOSPITAL_COMMUNITY): Payer: Self-pay | Admitting: *Deleted

## 2019-03-03 ENCOUNTER — Other Ambulatory Visit: Payer: Self-pay

## 2019-03-03 ENCOUNTER — Ambulatory Visit (HOSPITAL_COMMUNITY): Payer: Medicaid Other | Admitting: *Deleted

## 2019-03-03 DIAGNOSIS — O0990 Supervision of high risk pregnancy, unspecified, unspecified trimester: Secondary | ICD-10-CM

## 2019-03-03 DIAGNOSIS — Z362 Encounter for other antenatal screening follow-up: Secondary | ICD-10-CM

## 2019-03-03 DIAGNOSIS — O24119 Pre-existing diabetes mellitus, type 2, in pregnancy, unspecified trimester: Secondary | ICD-10-CM

## 2019-03-03 DIAGNOSIS — Z3A24 24 weeks gestation of pregnancy: Secondary | ICD-10-CM | POA: Diagnosis not present

## 2019-03-03 DIAGNOSIS — O162 Unspecified maternal hypertension, second trimester: Secondary | ICD-10-CM

## 2019-03-03 DIAGNOSIS — O10912 Unspecified pre-existing hypertension complicating pregnancy, second trimester: Secondary | ICD-10-CM

## 2019-03-03 DIAGNOSIS — O365921 Maternal care for other known or suspected poor fetal growth, second trimester, fetus 1: Secondary | ICD-10-CM | POA: Diagnosis not present

## 2019-03-03 DIAGNOSIS — O365990 Maternal care for other known or suspected poor fetal growth, unspecified trimester, not applicable or unspecified: Secondary | ICD-10-CM

## 2019-03-03 NOTE — Consult Note (Signed)
MFM Note   This patient was seen for a follow-up exam due to fetal growth restriction with absent end-diastolic flow noted during her prior ultrasound exams.  The fetus is also suspected to have bilateral cleft lip and palate.  The patient's pregnancy has also been complicated by extremely elevated blood pressures.  She is currently treated with a regimen of labetalol and nifedipine.  Adjustments have been made to her current antihypertensive regimen.  I had recommended that the patient be evaluated in the hospital following her last ultrasound exam.  The patient reports that she did not go to the hospital to be evaluated.    On today's exam, her initial blood pressure was 153/105.  On repeat it was 133/99.  The patient denies any signs or symptoms of severe preeclampsia.  The overall EFW obtained today (440g) measures at less than the 1st percentile for her gestational age.  There was normal amniotic fluid noted today.  Fetal movements were noted throughout today's ultrasound exam.  Doppler studies of the umbilical arteries performed today due to fetal growth restriction continues to show absent end-diastolic flow.  There were no signs of reversed end-diastolic flow.    The increased risk of superimposed preeclampsia and an IUFD due to her uncontrolled blood pressures was discussed with the patient again today.    She was advised regarding my recommendation for her to be evaluated in the hospital for preeclampsia and to make adjustments to her antihypertensive regimen so that her blood pressures could be better controlled.  The patient declined to go to the hospital today.  She would prefer to have her blood pressure medications adjusted as an outpatient.  The patient was advised that as the fetus is now potentially viable based on her gestational age and the fetal weight, we will continue to follow her closely with weekly exams.  The patient understands that a preterm delivery may be recommended  should reversed end-diastolic flow be noted on her umbilical artery Doppler studies or should she develop severe preeclampsia.  Preeclampsia precautions were reviewed with the patient again today.  A course of antenatal corticosteroids should be given should she be at risk for delivery.  Bilateral cleft lip and palate continues to be suspected in her fetus.  The patient was offered and declined an opportunity to have a repeat cell free DNA test drawn today for screening of fetal aneuploidy.  A follow-up exam was scheduled in 1 week.  A total of 15 minutes was spent counseling and coordinating the care for this patient.  Greater than 50% of the time was spent in direct face-to-face contact.

## 2019-03-09 ENCOUNTER — Encounter: Payer: Self-pay | Admitting: Obstetrics and Gynecology

## 2019-03-09 ENCOUNTER — Other Ambulatory Visit: Payer: Self-pay

## 2019-03-09 ENCOUNTER — Telehealth (INDEPENDENT_AMBULATORY_CARE_PROVIDER_SITE_OTHER): Payer: Medicaid Other | Admitting: Obstetrics and Gynecology

## 2019-03-09 DIAGNOSIS — O24112 Pre-existing diabetes mellitus, type 2, in pregnancy, second trimester: Secondary | ICD-10-CM

## 2019-03-09 DIAGNOSIS — Z3A25 25 weeks gestation of pregnancy: Secondary | ICD-10-CM

## 2019-03-09 DIAGNOSIS — O0992 Supervision of high risk pregnancy, unspecified, second trimester: Secondary | ICD-10-CM

## 2019-03-09 DIAGNOSIS — Z8639 Personal history of other endocrine, nutritional and metabolic disease: Secondary | ICD-10-CM

## 2019-03-09 DIAGNOSIS — O365920 Maternal care for other known or suspected poor fetal growth, second trimester, not applicable or unspecified: Secondary | ICD-10-CM

## 2019-03-09 DIAGNOSIS — O24119 Pre-existing diabetes mellitus, type 2, in pregnancy, unspecified trimester: Secondary | ICD-10-CM

## 2019-03-09 DIAGNOSIS — O0990 Supervision of high risk pregnancy, unspecified, unspecified trimester: Secondary | ICD-10-CM

## 2019-03-09 DIAGNOSIS — O365990 Maternal care for other known or suspected poor fetal growth, unspecified trimester, not applicable or unspecified: Secondary | ICD-10-CM

## 2019-03-09 DIAGNOSIS — O162 Unspecified maternal hypertension, second trimester: Secondary | ICD-10-CM

## 2019-03-09 DIAGNOSIS — O359XX0 Maternal care for (suspected) fetal abnormality and damage, unspecified, not applicable or unspecified: Secondary | ICD-10-CM

## 2019-03-09 HISTORY — DX: Maternal care for other known or suspected poor fetal growth, unspecified trimester, not applicable or unspecified: O36.5990

## 2019-03-09 NOTE — Progress Notes (Signed)
TELEHEALTH OBSTETRICS PRENATAL VIRTUAL VIDEO VISIT ENCOUNTER NOTE  Provider location: [facility] [place]   I connected with Brenda Casey on 03/09/19 at  2:15 PM EST by MyChart Video Encounter at home and verified that I am speaking with the correct person using two identifiers.   I discussed the limitations, risks, security and privacy concerns of performing an evaluation and management service virtually and the availability of in person appointments. I also discussed with the patient that there may be a patient responsible charge related to this service. The patient expressed understanding and agreed to proceed. Subjective:  Brenda Casey is a 34 y.o. G3P0020 at [redacted]w[redacted]d being seen today for ongoing prenatal care.  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 cleft lip and palate; and Pregnancy affected by fetal growth restriction on their problem list.  Patient reports no complaints.  Contractions: Not present. Vag. Bleeding: None.  Movement: Present. Denies any leaking of fluid.   The following portions of the patient's history were reviewed and updated as appropriate: allergies, current medications, past family history, past medical history, past social history, past surgical history and problem list.   Objective:   Vitals:   03/09/19 1418  BP: (!) 144/89  Pulse: 89    Fetal Status:     Movement:  Present     General:  Alert, oriented and cooperative. Patient is in no acute distress.  Respiratory: Normal respiratory effort, no problems with respiration noted  Mental Status: Normal mood and affect. Normal behavior. Normal judgment and thought content.  Rest of physical exam deferred due to type of encounter  Imaging:  Assessment and Plan:  Pregnancy: G3P0020 at [redacted]w[redacted]d  1. Supervision of high risk pregnancy, antepartum - Reviewed severe growth restriction, viability of gestational age and weight, that infants < 500 gms generally are not expected to survive, that there is high risk of IUFD prior to delivery given placental issues and/or pre-eclampsia. She verbalizes understanding of the above and is happy to continue plan set by Dr. Parke Poisson last week, to have f/u US tomorrow and see what it shows, understands she may need to go to hospital for steroids and/or delivery - answered all questions  2. Hypertension affecting pregnancy in second trimester Labetalol 800 mg BID Procardia 60 mg BID BP well controlled  3. Type 2 diabetes mellitus affecting pregnancy, antepartum On lantus 15 units daily CBGs reviewed in chart, (FG from today was mistake, her FG was 92) Generally pretty well controlled  4. Morbid obesity (HCC)  5. History of hypothyroidism Recheck TSH at 28 weeks  6. Pregnancy affected by fetal growth restriction 440 gms, <1st% at 25 weeks Absent end diastolic flow Has f/u US tomorrow  7. Prior exam suggested fetal anomaly, antepartum, single or unspecified fetus ? Cleft lip/palate   Preterm labor symptoms and general obstetric precautions including but not limited to vaginal bleeding, contractions, leaking of fluid and fetal movement were reviewed in detail with the patient. I discussed the assessment and treatment plan with the patient. The patient was provided an opportunity  to ask questions and all were answered. The patient agreed with the plan and demonstrated an  understanding of the instructions. The patient was advised to call back or seek an in-person office evaluation/go to MAU at [facility] for any urgent or concerning symptoms. Please refer to After Visit Summary for other counseling recommendations.   I provided 22 minutes of face-to-face time during this encounter.  Return in about 2 weeks (around 03/23/2019) for high OB, in person, TSH, 3rd trim labs, does not need 2 hr.  Future Appointments  Date Time Provider [place]  03/10/2019  4:00 PM [place] NURSE [place] [facility]  03/10/2019  4:00 PM [place] 3 WH-MFCUS M[facility]-US  03/18/2019  3:00 PM [place] NURSE [place] [facility]-US  03/18/2019  3:00 PM [place] 3 WH-MFCUS MFC-US  03/22/2019  8:15 AM [place]  03/25/2019  3:45 PM [place] NURSE [place] [facility]-US  03/25/2019  3:45 PM [place], [place] for [place], [place]

## 2019-03-09 NOTE — Progress Notes (Signed)
I connected with  [name] on 03/09/19 at  2:15 PM EST by MyChart and verified that I am speaking with the correct person using two identifiers.   I discussed the limitations, risks, security and privacy concerns of performing an evaluation and management service by telephone and the availability of in person appointments. I also discussed with the patient that there may be a patient responsible charge related to this service. The patient expressed understanding and agreed to proceed.      Marjo Bicker, RN 03/09/2019  2:16 PM

## 2019-03-10 ENCOUNTER — Encounter (HOSPITAL_COMMUNITY): Payer: Self-pay

## 2019-03-10 ENCOUNTER — Ambulatory Visit (HOSPITAL_COMMUNITY): Payer: Medicaid Other

## 2019-03-10 ENCOUNTER — Ambulatory Visit (HOSPITAL_COMMUNITY): Admission: RE | Admit: 2019-03-10 | Payer: Medicaid Other | Source: Ambulatory Visit

## 2019-03-11 ENCOUNTER — Encounter: Payer: Self-pay | Admitting: Obstetrics and Gynecology

## 2019-03-11 NOTE — Progress Notes (Signed)
Reviewed data from Babyscripts, patient has not been logging CBGs 4x per day. Readings within normal range. Per provider note, she was seen recently in office and CBGs reviewed with provider. MyChart message sent asking patient to make sure she is checking CBGs and logging them into app.    Baldemar Lenis, M.D. Attending [facility])

## 2019-03-18 ENCOUNTER — Encounter (HOSPITAL_COMMUNITY): Payer: Self-pay | Admitting: Obstetrics & Gynecology

## 2019-03-18 ENCOUNTER — Inpatient Hospital Stay (HOSPITAL_COMMUNITY)
Admission: AD | Admit: 2019-03-18 | Discharge: 2019-03-21 | DRG: 832 | Disposition: A | Discharge status: Home / Self Care | Payer: Medicaid Other | Attending: Obstetrics and Gynecology | Admitting: Obstetrics and Gynecology

## 2019-03-18 ENCOUNTER — Other Ambulatory Visit (HOSPITAL_COMMUNITY): Payer: Self-pay | Admitting: Obstetrics

## 2019-03-18 ENCOUNTER — Encounter (HOSPITAL_COMMUNITY): Payer: Self-pay

## 2019-03-18 ENCOUNTER — Other Ambulatory Visit: Payer: Self-pay

## 2019-03-18 ENCOUNTER — Ambulatory Visit (HOSPITAL_BASED_OUTPATIENT_CLINIC_OR_DEPARTMENT_OTHER)
Admission: RE | Admit: 2019-03-18 | Discharge: 2019-03-18 | Disposition: A | Discharge status: Home / Self Care | Payer: Medicaid Other | Source: Ambulatory Visit | Attending: Obstetrics and Gynecology | Admitting: Obstetrics and Gynecology

## 2019-03-18 ENCOUNTER — Ambulatory Visit (HOSPITAL_COMMUNITY): Payer: Medicaid Other | Admitting: *Deleted

## 2019-03-18 DIAGNOSIS — O24112 Pre-existing diabetes mellitus, type 2, in pregnancy, second trimester: Secondary | ICD-10-CM

## 2019-03-18 DIAGNOSIS — Z3A26 26 weeks gestation of pregnancy: Secondary | ICD-10-CM

## 2019-03-18 DIAGNOSIS — Z794 Long term (current) use of insulin: Secondary | ICD-10-CM | POA: Diagnosis not present

## 2019-03-18 DIAGNOSIS — Z362 Encounter for other antenatal screening follow-up: Secondary | ICD-10-CM

## 2019-03-18 DIAGNOSIS — Z20822 Contact with and (suspected) exposure to covid-19: Secondary | ICD-10-CM | POA: Diagnosis present

## 2019-03-18 DIAGNOSIS — O365990 Maternal care for other known or suspected poor fetal growth, unspecified trimester, not applicable or unspecified: Secondary | ICD-10-CM | POA: Diagnosis not present

## 2019-03-18 DIAGNOSIS — O24119 Pre-existing diabetes mellitus, type 2, in pregnancy, unspecified trimester: Secondary | ICD-10-CM | POA: Diagnosis present

## 2019-03-18 DIAGNOSIS — O169 Unspecified maternal hypertension, unspecified trimester: Secondary | ICD-10-CM | POA: Diagnosis present

## 2019-03-18 DIAGNOSIS — O162 Unspecified maternal hypertension, second trimester: Secondary | ICD-10-CM | POA: Diagnosis not present

## 2019-03-18 DIAGNOSIS — E119 Type 2 diabetes mellitus without complications: Secondary | ICD-10-CM | POA: Diagnosis present

## 2019-03-18 DIAGNOSIS — O10012 Pre-existing essential hypertension complicating pregnancy, second trimester: Secondary | ICD-10-CM | POA: Diagnosis present

## 2019-03-18 DIAGNOSIS — O0990 Supervision of high risk pregnancy, unspecified, unspecified trimester: Secondary | ICD-10-CM

## 2019-03-18 DIAGNOSIS — O99212 Obesity complicating pregnancy, second trimester: Secondary | ICD-10-CM

## 2019-03-18 DIAGNOSIS — O365920 Maternal care for other known or suspected poor fetal growth, second trimester, not applicable or unspecified: Secondary | ICD-10-CM | POA: Diagnosis present

## 2019-03-18 HISTORY — DX: Maternal care for other known or suspected poor fetal growth, unspecified trimester, not applicable or unspecified: O36.5990

## 2019-03-18 LAB — CBC
HCT: 42.2 % (ref 36.0–46.0)
Hemoglobin: 14.2 g/dL (ref 12.0–15.0)
MCH: 26.7 pg (ref 26.0–34.0)
MCHC: 33.6 g/dL (ref 30.0–36.0)
MCV: 79.5 fL — ABNORMAL LOW (ref 80.0–100.0)
Platelets: 324 10*3/uL (ref 150–400)
RBC: 5.31 MIL/uL — ABNORMAL HIGH (ref 3.87–5.11)
RDW: 14.1 % (ref 11.5–15.5)
WBC: 13.4 10*3/uL — ABNORMAL HIGH (ref 4.0–10.5)
nRBC: 0 % (ref 0.0–0.2)

## 2019-03-18 LAB — TYPE AND SCREEN
ABO/RH(D): O POS
Antibody Screen: NEGATIVE

## 2019-03-18 LAB — CREATININE, SERUM
Creatinine, Ser: 0.82 mg/dL (ref 0.44–1.00)
GFR calc Af Amer: >60 mL/min (ref >60)
GFR calc non Af Amer: >60 mL/min (ref >60)

## 2019-03-18 LAB — ABO/RH: ABO/RH(D): O POS

## 2019-03-18 LAB — GLUCOSE, CAPILLARY: Glucose-Capillary: 108 mg/dL — ABNORMAL HIGH (ref 70–99)

## 2019-03-18 LAB — SARS CORONAVIRUS 2 (TAT 6-24 HRS): SARS Coronavirus 2: NEGATIVE

## 2019-03-18 MED ORDER — LABETALOL HCL 5 MG/ML IV SOLN: 40.0000 mg | INTRAVENOUS | Status: DC | PRN

## 2019-03-18 MED ORDER — INSULIN GLARGINE 100 UNIT/ML ~~LOC~~ SOLN
15.0000 [IU] | Freq: Every day | SUBCUTANEOUS | Status: DC
  Administered 2019-03-19 – 2019-03-20 (×2): 15 [IU] via SUBCUTANEOUS
  Filled 2019-03-18 (×3): qty 0.15

## 2019-03-18 MED ORDER — ASPIRIN 81 MG PO CHEW
81.0000 mg | CHEWABLE_TABLET | Freq: Every day | ORAL | Status: DC
  Administered 2019-03-19 – 2019-03-21 (×3): 81 mg via ORAL
  Filled 2019-03-18 (×3): qty 1

## 2019-03-18 MED ORDER — CALCIUM CARBONATE ANTACID 500 MG PO CHEW: 2.0000 | CHEWABLE_TABLET | ORAL | Status: DC | PRN

## 2019-03-18 MED ORDER — NIFEDIPINE 10 MG PO CAPS: 20.0000 mg | ORAL_CAPSULE | ORAL | Status: DC | PRN

## 2019-03-18 MED ORDER — NIFEDIPINE ER OSMOTIC RELEASE 30 MG PO TB24
60.0000 mg | ORAL_TABLET | Freq: Two times a day (BID) | ORAL | Status: DC
  Administered 2019-03-18 – 2019-03-21 (×6): 60 mg via ORAL
  Filled 2019-03-18 (×6): qty 2

## 2019-03-18 MED ORDER — INSULIN ASPART 100 UNIT/ML ~~LOC~~ SOLN
0.0000 [IU] | Freq: Three times a day (TID) | SUBCUTANEOUS | Status: DC
  Administered 2019-03-18: 21:00:00 3 [IU] via SUBCUTANEOUS
  Administered 2019-03-19: 6 [IU] via SUBCUTANEOUS
  Administered 2019-03-19 (×3): 4 [IU] via SUBCUTANEOUS

## 2019-03-18 MED ORDER — ENOXAPARIN SODIUM 80 MG/0.8ML ~~LOC~~ SOLN
70.0000 mg | SUBCUTANEOUS | Status: DC
  Administered 2019-03-18 – 2019-03-20 (×3): 70 mg via SUBCUTANEOUS
  Filled 2019-03-18 (×3): qty 0.8

## 2019-03-18 MED ORDER — SODIUM CHLORIDE 0.9% FLUSH: 3.0000 mL | INTRAVENOUS | Status: DC | PRN

## 2019-03-18 MED ORDER — ZOLPIDEM TARTRATE 5 MG PO TABS: 5.0000 mg | ORAL_TABLET | Freq: Every evening | ORAL | Status: DC | PRN

## 2019-03-18 MED ORDER — NIFEDIPINE 10 MG PO CAPS
10.0000 mg | ORAL_CAPSULE | ORAL | Status: DC | PRN
  Administered 2019-03-18: 20:00:00 10 mg via ORAL
  Filled 2019-03-18: qty 1

## 2019-03-18 MED ORDER — DOCUSATE SODIUM 100 MG PO CAPS
100.0000 mg | ORAL_CAPSULE | Freq: Every day | ORAL | Status: DC
  Administered 2019-03-19 – 2019-03-21 (×2): 100 mg via ORAL
  Filled 2019-03-18 (×3): qty 1

## 2019-03-18 MED ORDER — SODIUM CHLORIDE 0.9 % IV SOLN: 250.0000 mL | INTRAVENOUS | Status: DC | PRN

## 2019-03-18 MED ORDER — PRENATAL MULTIVITAMIN CH
1.0000 | ORAL_TABLET | Freq: Every day | ORAL | Status: DC
  Administered 2019-03-19 – 2019-03-21 (×3): 1 via ORAL
  Filled 2019-03-18 (×3): qty 1

## 2019-03-18 MED ORDER — BETAMETHASONE SOD PHOS & ACET 6 (3-3) MG/ML IJ SUSP
12.0000 mg | INTRAMUSCULAR | Status: AC
  Administered 2019-03-18 – 2019-03-19 (×2): 12 mg via INTRAMUSCULAR
  Filled 2019-03-18: qty 5

## 2019-03-18 MED ORDER — INSULIN ASPART 100 UNIT/ML ~~LOC~~ SOLN
5.0000 [IU] | Freq: Three times a day (TID) | SUBCUTANEOUS | Status: DC
  Administered 2019-03-19 (×3): 5 [IU] via SUBCUTANEOUS

## 2019-03-18 MED ORDER — SODIUM CHLORIDE 0.9% FLUSH
3.0000 mL | Freq: Two times a day (BID) | INTRAVENOUS | Status: DC
  Administered 2019-03-18 – 2019-03-21 (×5): 3 mL via INTRAVENOUS

## 2019-03-18 MED ORDER — LABETALOL HCL 200 MG PO TABS
800.0000 mg | ORAL_TABLET | Freq: Two times a day (BID) | ORAL | Status: DC
  Administered 2019-03-18 – 2019-03-19 (×3): 800 mg via ORAL
  Filled 2019-03-18 (×3): qty 4

## 2019-03-18 MED ORDER — ACETAMINOPHEN 325 MG PO TABS: 650.0000 mg | ORAL_TABLET | ORAL | Status: DC | PRN

## 2019-03-18 NOTE — H&P (Addendum)
Brenda Casey is an 34 y.o. G23P0020 [redacted]w[redacted]d female.   Chief Complaint: abnormal dopplers HPI: Here following MFM scan which reveals IUGR and abnormal dopplers with intermittent REDF. Has CHTN, on 2 meds and DM type 2, Class R.  Past Medical History:  Diagnosis Date  . Diabetes mellitus without complication ([place])   . Essential hypertension   . Hypothyroidism   . Morbid obesity ([place]) 07/30/2018   Last Assessment & Plan:  Uncontrolled patient and I discussed lifestyle and dietary measures to lower BMI, will continue to monitor.  . Obesity   . Retinopathy   . Thyroid disease     Past Surgical History:  Procedure Laterality Date  . NO PAST SURGERIES      Family History  Problem Relation Age of Onset  . Diabetes Mother   . Hypertension Mother   . Breast cancer Mother   . Diabetes Father   . Hypertension Father   . Breast cancer Maternal Grandmother    Social History:  reports that she has never smoked. She has never used smokeless tobacco. She reports that she does not drink alcohol or use drugs.    Allergies  Allergen Reactions  . Monosodium Glutamate Hives  . Dairy Aid [place]  . Other Hives    MSG  . Soy Allergy Hives  . Lac Bovis Diarrhea    Medications Prior to Admission  Medication Sig Dispense Refill  . ACCU-CHEK GUIDE test strip PLEASE CHECK BLOOD SUGAR THREE TIMES DAILY.    Marland Kitchen aspirin 81 MG chewable tablet Chew 1 tablet (81 mg total) by mouth daily. 90 tablet 3  . BD PEN NEEDLE NANO U/F 32G X 4 MM MISC USE WITH INSULIN PEN AS DIRECTED 100 each 2  . Insulin Glargine (BASAGLAR KWIKPEN) 100 UNIT/ML SOPN Inject 0.15 mLs (15 Units total) into the skin daily. 15 mL 2  . labetalol (NORMODYNE) 200 MG tablet Take 4 tablets (800 mg total) by mouth 2 (two) times daily. 180 tablet 3  . Lancets (ACCU-CHEK MULTICLIX) lancets Please check blood sugar three times daily.    Marland Kitchen NIFEdipine (PROCARDIA-XL/NIFEDICAL-XL) 30 MG 24 hr tablet Take 2 tablets (60 mg total) by mouth 2  (two) times daily. Can increase to twice a day as needed for symptomatic contractions 60 tablet 2  . Prenatal Vit-Fe Fumarate-FA (MULTIVITAMIN-PRENATAL) 27-0.8 MG TABS tablet Take 1 tablet by mouth daily at 12 noon.       A comprehensive review of systems was negative.  Last menstrual period 08/31/2018, unknown if currently breastfeeding. LMP 08/31/2018  General appearance: alert, cooperative, appears stated age and morbidly obese Head: Normocephalic, without obvious abnormality, atraumatic Neck: supple, symmetrical, trachea midline Lungs: normal effort Heart: regular rate and rhythm Abdomen: gravid, non-tender Extremities: Homans sign is negative, no sign of DVT Skin: Skin color, texture, turgor normal. No rashes or lesions Neurologic: Grossly normal NST:  Baseline: 150 bpm, Variability: Fair (1-6 bpm), Accelerations: non-reactive and Decelerations: Absent    Lab Results  Component Value Date   WBC 11.8 (H) 12/08/2018   HGB 13.7 12/08/2018   HCT 42.1 12/08/2018   MCV 82 12/08/2018   PLT 341 12/08/2018         ABO, Rh: O/Positive/-- (10/21 1139)  Antibody: Negative (10/21 1139)  Rubella: 3.46 (10/21 1139)  RPR: Non Reactive (10/21 1139)  HBsAg: Negative (10/21 1139)  HIV: Non Reactive (10/21 1139)  GBS:       Assessment/Plan Principal Problem:   Pregnancy affected by fetal growth restriction Active  Problems:   Hypertension affecting pregnancy   Type 2 diabetes mellitus affecting pregnancy, antepartum   IUGR (intrauterine growth restriction) affecting care of mother      with intermittent REDF on UA Dopplers  For ante admission BID NST if FHR is reassuring NICU consult Repeat Dopplers 2/1 BMZ x 2 Insulin Lantus, home + add short acting meal coverage for BMZ + sliding scale Procardia and Labetalol and good BP control  Reva Bores 03/18/2019, 7:01 PM

## 2019-03-18 NOTE — Progress Notes (Signed)
Pt left MFC today to be admitted in [facility] speciality care for BMZ x2 & repeat dopplers on Monday per Dr.Shankar/Constant.  Pt advised to go straight to [facility] to be admitted.  Report given to Raynelle Fanning, Consulting civil engineer in [employer]. Care.

## 2019-03-19 LAB — CBC
HCT: 40.8 % (ref 36.0–46.0)
Hemoglobin: 13.9 g/dL (ref 12.0–15.0)
MCH: 27.2 pg (ref 26.0–34.0)
MCHC: 34.1 g/dL (ref 30.0–36.0)
MCV: 79.8 fL — ABNORMAL LOW (ref 80.0–100.0)
Platelets: 337 10*3/uL (ref 150–400)
RBC: 5.11 MIL/uL (ref 3.87–5.11)
RDW: 13.9 % (ref 11.5–15.5)
WBC: 11.6 10*3/uL — ABNORMAL HIGH (ref 4.0–10.5)
nRBC: 0 % (ref 0.0–0.2)

## 2019-03-19 LAB — COMPREHENSIVE METABOLIC PANEL
ALT: 30 U/L (ref 0–44)
AST: 23 U/L (ref 15–41)
Albumin: 2.6 g/dL — ABNORMAL LOW (ref 3.5–5.0)
Alkaline Phosphatase: 54 U/L (ref 38–126)
Anion gap: 10 (ref 5–15)
BUN: 11 mg/dL (ref 6–20)
CO2: 20 mmol/L — ABNORMAL LOW (ref 22–32)
Calcium: 9.2 mg/dL (ref 8.9–10.3)
Chloride: 104 mmol/L (ref 98–111)
Creatinine, Ser: 0.83 mg/dL (ref 0.44–1.00)
GFR calc Af Amer: >60 mL/min (ref >60)
GFR calc non Af Amer: >60 mL/min (ref >60)
Glucose, Bld: 173 mg/dL — ABNORMAL HIGH (ref 70–99)
Potassium: 4.2 mmol/L (ref 3.5–5.1)
Sodium: 134 mmol/L — ABNORMAL LOW (ref 135–145)
Total Bilirubin: 0.4 mg/dL (ref 0.3–1.2)
Total Protein: 6.5 g/dL (ref 6.5–8.1)

## 2019-03-19 LAB — PROTEIN / CREATININE RATIO, URINE
Creatinine, Urine: 265.33 mg/dL
Protein Creatinine Ratio: 0.13 mg/mg{Cre} (ref 0.00–0.15)
Total Protein, Urine: 34 mg/dL

## 2019-03-19 LAB — GLUCOSE, CAPILLARY
Glucose-Capillary: 160 mg/dL — ABNORMAL HIGH (ref 70–99)
Glucose-Capillary: 173 mg/dL — ABNORMAL HIGH (ref 70–99)
Glucose-Capillary: 140 mg/dL — ABNORMAL HIGH (ref 70–99)
Glucose-Capillary: 123 mg/dL — ABNORMAL HIGH (ref 70–99)

## 2019-03-19 NOTE — Consult Note (Signed)
[facility]  [facility] 03/19/2019    5:37 PM  Neonatal Medicine Consultation         Brenda Casey          MRN:  [id number]  I was called at the request of the patient's obstetrician (Dr. Shawnie Pons) to speak to this patient due to potential premature delivery as early as 26 weeks.  The patient's prenatal course includes severe IUGR, type 2 diabetes on insulin, chronic hypertension.  She is 26 weeks currently.  She is admitted to [facility], and is receiving treatment that includes betamethasone.  The baby is a female.  I reviewed expectations for a baby born at 26+ weeks, including survival, length of stay, morbidities such as respiratory distress, IVH, infection, feeding intolerance, retinopathy.  I described how we provide respiratory and feeding support.  Mom plans to breast feed, which I encouraged as best for the baby (with supplementations for needed calories).  I let mom know that the baby's outlook generally improves the longer she remains undelivered.  I spent 20 minutes reviewing the record, speaking to the patient, and entering appropriate documentation.  More than 50% of the time was spent face to face with patient.   _____________________ Angelita Ingles, MD Attending Neonatologist

## 2019-03-19 NOTE — Progress Notes (Signed)
Inpatient Diabetes Program Recommendations  Diabetes Treatment Program Recommendations  ADA Standards of Care Diabetes in Pregnancy Target Glucose Ranges:  Fasting: 60 - 90 mg/dL Preprandial: 60 - 233 mg/dL 1 hr postprandial: Less than 140mg /dL (from first bite of meal) 2 hr postprandial: Less than 120 mg/dL (from first bite of meal)   Results for Brenda Casey, Brenda Casey (MRN [name]) as of 03/19/2019 09:42  Ref. Range 03/18/2019 20:40 03/19/2019 08:15  Glucose-Capillary Latest Ref Range: 70 - 99 mg/dL 03/21/2019 (H) 354 (H)   Review of Glycemic Control  Diabetes history: DM2 Outpatient Diabetes medications: Basaglar 15 units daily Current orders for Inpatient glycemic control: Lantus 15 units daily, Novolog 5 units TID with meals, Novolog 0-24 units TID after meals   Inpatient Diabetes Program Recommendations:    Insulin-Correction: Please consider changing frequency of CBGs and Novolog 0-24 units to QID (fasting and 2 hour post prandial).  NOTE: Noted consult for Diabetes Coordinator. Diabetes Coordinator is not on campus over the weekend but available by pager from 8am to 5pm for questions or concerns. Chart reviewed. Per chart, patient noted to [redacted]W[redacted]D gestation and was given Betamethasone 12 mg on 03/18/19 and scheduled to get 2nd dose today. Anticipate steroids will contribute to hyperglycemia.   Thanks, 03/20/19, RN, MSN, CDE Diabetes Coordinator Inpatient Diabetes Program [phone] (Team Pager from 8am to 5pm)

## 2019-03-19 NOTE — Progress Notes (Signed)
   03/18/19 2124 03/18/19 2342 03/19/19 0319  Vital Signs  BP (!) 150/90 (!) 151/98 (nurse notified) 136/84    03/19/19 0803 03/19/19 0818 03/19/19 0831  Vital Signs  BP (!) 159/96 (!) 162/99 (with burgundy cuff) (!) 158/97    03/19/19 0846 03/19/19 0901  Vital Signs  BP (!) 159/94 (!) 148/100  Above Bps reviewed by Dr Marice Potter orders rec'd to give scheduled blood pressure meds & continue to monitor.  Also reported fasting CBG 160 (after 1st dose BMZ last pm).  Orders rec'd to follow postprandial sliding scale to treat.

## 2019-03-19 NOTE — Progress Notes (Signed)
Pt states she has not voided yet to collect the ordered PCR.  Pt encouraged to drink more water.

## 2019-03-19 NOTE — Plan of Care (Signed)
  Problem: Education: Goal: Knowledge of the prescribed therapeutic regimen will improve Outcome: Progressing   Problem: Clinical Measurements: Goal: Complications related to the disease process, condition or treatment will be avoided or minimized Outcome: Progressing   Problem: Clinical Measurements: Goal: Ability to maintain clinical measurements within normal limits will improve Outcome: Progressing Note: Hypertension noted today, however, hypertensive protocol not needed.  Pt has remained free of s/s of preeclampsia at this time.   Problem: Elimination: Goal: Will not experience complications related to urinary retention Outcome: Progressing Note: Pt does not void often, encouraged to drink more water to increase urine output.   Problem: Fluid Volume: Goal: Ability to maintain a balanced intake and output will improve Outcome: Progressing   Problem: Metabolic: Goal: Ability to maintain appropriate glucose levels will improve Outcome: Progressing Note: Expected hyperglycemia d/t betamethasone administration noted.  Being managed with pre-meal & sliding scale insulin.  Per Dr Marice Potter, use sliding scale for fasting CBG if needed.   Problem: Education: Goal: Knowledge of the prescribed therapeutic regimen will improve Outcome: Progressing   Problem: Clinical Measurements: Goal: Complications related to disease process, condition or treatment will be avoided or minimized Outcome: Progressing

## 2019-03-19 NOTE — Progress Notes (Signed)
Dr Katrinka Blazing, NICU attending, notified of consult. Report given on patient.

## 2019-03-19 NOTE — Progress Notes (Signed)
Patient ID: Brenda Casey, female   DOB: 07-02-1985, 34 y.o.   MRN: [id number] [place]) NOTE  Brenda Casey is a 34 y.o. G3P0020 at [redacted]w[redacted]d by best clinical estimate who is admitted for IUGR, and Abnl dopplers.   Fetal presentation is cephalic. Length of Stay:  1  Days  ASSESSMENT: Principal Problem:   Pregnancy affected by fetal growth restriction Active Problems:   Hypertension affecting pregnancy   Type 2 diabetes mellitus affecting pregnancy, antepartum   IUGR (intrauterine growth restriction) affecting care of mother   PLAN: 2nd BMZ today Monitor BP closely CBG control NICU consult  Subjective: Feels well. Patient reports the fetal movement as active. Patient reports uterine contraction  activity as none. Patient reports  vaginal bleeding as none. Patient describes fluid per vagina as None.  Vitals:  Blood pressure 136/84, pulse 91, temperature 99 F (37.2 C), temperature source Oral, resp. rate 18, height 5\' 4"  (1.626 m), weight 131 kg, last menstrual period 08/31/2018, SpO2 99 %, unknown if currently breastfeeding. Physical Examination:  General appearance - alert, well appearing, and in no distress Chest - normal effort Abdomen - gravid, non-tender Fundal Height:  size equals dates Extremities: Homans sign is negative, no sign of DVT  Membranes:intact  Fetal Monitoring:  Baseline: 130 bpm, Variability: Good {> 6 bpm), Accelerations: Reactive and Decelerations: Absent  Labs:  Results for orders placed or performed during the hospital encounter of 03/18/19 (from the past 24 hour(s))  SARS CORONAVIRUS 2 (TAT 6-24 HRS) Nasopharyngeal Nasopharyngeal Swab   Collection Time: 03/18/19  7:01 PM   Specimen: Nasopharyngeal Swab  Result Value Ref Range   SARS Coronavirus 2 NEGATIVE NEGATIVE  Glucose, capillary   Collection Time: 03/18/19  8:40 PM  Result Value Ref Range   Glucose-Capillary 108 (H) 70 - 99 mg/dL  CBC   Collection  Time: 03/18/19  9:08 PM  Result Value Ref Range   WBC 13.4 (H) 4.0 - 10.5 K/uL   RBC 5.31 (H) 3.87 - 5.11 MIL/uL   Hemoglobin 14.2 12.0 - 15.0 g/dL   HCT 42.2 36.0 - 46.0 %   MCV 79.5 (L) 80.0 - 100.0 fL   MCH 26.7 26.0 - 34.0 pg   MCHC 33.6 30.0 - 36.0 g/dL   RDW 14.1 11.5 - 15.5 %   Platelets 324 150 - 400 K/uL   nRBC 0.0 0.0 - 0.2 %  Creatinine, serum   Collection Time: 03/18/19  9:08 PM  Result Value Ref Range   Creatinine, Ser 0.82 0.44 - 1.00 mg/dL   GFR calc non Af Amer >60 >60 mL/min   GFR calc Af Amer >60 >60 mL/min  Type and screen [place]   Collection Time: 03/18/19  9:08 PM  Result Value Ref Range   ABO/RH(D) O POS    Antibody Screen NEG    Sample Expiration      03/21/2019,2359 Performed at [facility], [place] [address] [place], [place] [address]   ABO/Rh   Collection Time: 03/18/19  9:08 PM  Result Value Ref Range   ABO/RH(D)      O POS Performed at [facility] [address] [place], [place] [address]      Medications:  Scheduled . aspirin  81 mg Oral Daily  . betamethasone acetate-betamethasone sodium phosphate  12 mg Intramuscular Q24H  . docusate sodium  100 mg Oral Daily  . enoxaparin (LOVENOX) injection  70 mg Subcutaneous Q24H  . insulin aspart  0-24  Units Subcutaneous TID PC  . insulin aspart  5 Units Subcutaneous TID WC  . insulin glargine  15 Units Subcutaneous Daily  . labetalol  800 mg Oral BID  . NIFEdipine  60 mg Oral BID  . prenatal multivitamin  1 tablet Oral Q1200  . sodium chloride flush  3 mL Intravenous Q12H   I have reviewed the patient's current medications.   Reva Bores, MD 03/19/2019,7:54 AM

## 2019-03-20 LAB — GLUCOSE, CAPILLARY
Glucose-Capillary: 179 mg/dL — ABNORMAL HIGH (ref 70–99)
Glucose-Capillary: 185 mg/dL — ABNORMAL HIGH (ref 70–99)
Glucose-Capillary: 219 mg/dL — ABNORMAL HIGH (ref 70–99)
Glucose-Capillary: 138 mg/dL — ABNORMAL HIGH (ref 70–99)

## 2019-03-20 MED ORDER — INSULIN ASPART 100 UNIT/ML ~~LOC~~ SOLN
8.0000 [IU] | Freq: Three times a day (TID) | SUBCUTANEOUS | Status: DC
  Administered 2019-03-20 (×3): 8 [IU] via SUBCUTANEOUS

## 2019-03-20 MED ORDER — INSULIN ASPART 100 UNIT/ML ~~LOC~~ SOLN
0.0000 [IU] | Freq: Three times a day (TID) | SUBCUTANEOUS | Status: DC
  Administered 2019-03-20: 17:00:00 9 [IU] via SUBCUTANEOUS
  Administered 2019-03-20: 08:00:00 6 [IU] via SUBCUTANEOUS
  Administered 2019-03-20: 4 [IU] via SUBCUTANEOUS
  Administered 2019-03-20: 12:00:00 6 [IU] via SUBCUTANEOUS
  Administered 2019-03-21 (×2): 3 [IU] via SUBCUTANEOUS

## 2019-03-20 MED ORDER — LABETALOL HCL 200 MG PO TABS
800.0000 mg | ORAL_TABLET | Freq: Three times a day (TID) | ORAL | Status: DC
  Administered 2019-03-20 – 2019-03-21 (×5): 800 mg via ORAL
  Filled 2019-03-20 (×5): qty 4

## 2019-03-20 NOTE — Plan of Care (Signed)
  Problem: Education: Goal: Knowledge of disease or condition will improve Outcome: Progressing Goal: Knowledge of the prescribed therapeutic regimen will improve Outcome: Progressing   Problem: Clinical Measurements: Goal: Complications related to the disease process, condition or treatment will be avoided or minimized Outcome: Progressing   Problem: Fluid Volume: Goal: Ability to maintain a balanced intake and output will improve Outcome: Progressing   Problem: Metabolic: Goal: Ability to maintain appropriate glucose levels will improve Outcome: Progressing Note: Pt remains hyperglycemic secondary to betamethasone administration.  Will con't to manage with sliding scale.   Problem: Education: Goal: Knowledge of disease or condition will improve Outcome: Progressing Goal: Knowledge of the prescribed therapeutic regimen will improve Outcome: Progressing   Problem: Clinical Measurements: Goal: Complications related to disease process, condition or treatment will be avoided or minimized Outcome: Progressing Note: Blood pressures remain elevated, however, pt denies symptoms of preE.

## 2019-03-20 NOTE — Progress Notes (Signed)
Patient ID: Brenda Casey, female   DOB: 24-Sep-1985, 34 y.o.   MRN: [id number]  Brenda Casey is a 34 y.o. G3P0020 at [redacted]w[redacted]d by best clinical estimate who is admitted for IUGR, and Abnl dopplers.   Fetal presentation is cephalic. Length of Stay:  2  Days  ASSESSMENT: Principal Problem:   Pregnancy affected by fetal growth restriction Active Problems:   Hypertension affecting pregnancy   Type 2 diabetes mellitus affecting pregnancy, antepartum   IUGR (intrauterine growth restriction) affecting care of mother   PLAN: 2nd BMZ yesterday  Monitor BP closely- still with elevated pressures - I will increase her labetalol from 800 mg BID to TID. She is also on procardia 60 mg BID - CMP was okay yesterday  CBG control- sugars still elevated (partially due to BMZ)- I have increased her with meals regular from 5 to 8. She also take lantus 15 units qAM  NICU consult done yesterday  Tomorrow she will have repeat doppler studies.  Subjective: Feels well. Patient reports the fetal movement as active. Patient reports uterine contraction  activity as none. Patient reports  vaginal bleeding as none. Patient describes fluid per vagina as None.  Vitals:  Blood pressure 140s-150s/90s,  temperature 99 F (37.2 C), temperature source Oral, resp. rate 18, height 5\' 4"  (1.626 m), weight 131 kg, last menstrual period 08/31/2018, SpO2 99 %, unknown if currently breastfeeding. Physical Examination:  General appearance - alert, well appearing, and in no distress Chest - normal effort Abdomen - gravid, non-tender Fundal Height:  size equals dates Extremities: Homans sign is negative, no sign of DVT  Membranes:intact  Fetal Monitoring:  Baseline: 130 bpm, Variability: Good {> 6 bpm), Accelerations: Reactive and Decelerations: Absent

## 2019-03-21 ENCOUNTER — Inpatient Hospital Stay (HOSPITAL_BASED_OUTPATIENT_CLINIC_OR_DEPARTMENT_OTHER): Payer: Medicaid Other

## 2019-03-21 ENCOUNTER — Other Ambulatory Visit: Payer: Self-pay | Admitting: Obstetrics and Gynecology

## 2019-03-21 DIAGNOSIS — Z3A26 26 weeks gestation of pregnancy: Secondary | ICD-10-CM

## 2019-03-21 DIAGNOSIS — O365920 Maternal care for other known or suspected poor fetal growth, second trimester, not applicable or unspecified: Secondary | ICD-10-CM

## 2019-03-21 DIAGNOSIS — O24112 Pre-existing diabetes mellitus, type 2, in pregnancy, second trimester: Secondary | ICD-10-CM

## 2019-03-21 DIAGNOSIS — O99212 Obesity complicating pregnancy, second trimester: Secondary | ICD-10-CM | POA: Diagnosis not present

## 2019-03-21 DIAGNOSIS — O10012 Pre-existing essential hypertension complicating pregnancy, second trimester: Secondary | ICD-10-CM | POA: Diagnosis not present

## 2019-03-21 DIAGNOSIS — O24119 Pre-existing diabetes mellitus, type 2, in pregnancy, unspecified trimester: Secondary | ICD-10-CM

## 2019-03-21 DIAGNOSIS — Z794 Long term (current) use of insulin: Secondary | ICD-10-CM

## 2019-03-21 DIAGNOSIS — O365990 Maternal care for other known or suspected poor fetal growth, unspecified trimester, not applicable or unspecified: Secondary | ICD-10-CM

## 2019-03-21 DIAGNOSIS — O162 Unspecified maternal hypertension, second trimester: Secondary | ICD-10-CM

## 2019-03-21 LAB — GLUCOSE, CAPILLARY
Glucose-Capillary: 108 mg/dL — ABNORMAL HIGH (ref 70–99)
Glucose-Capillary: 120 mg/dL — ABNORMAL HIGH (ref 70–99)

## 2019-03-21 MED ORDER — LABETALOL HCL 200 MG PO TABS: 800.0000 mg | ORAL_TABLET | Freq: Three times a day (TID) | ORAL | 3 refills | Status: DC

## 2019-03-21 MED ORDER — NIFEDIPINE ER 60 MG PO TB24: 60.0000 mg | ORAL_TABLET | Freq: Two times a day (BID) | ORAL | 3 refills | Status: DC

## 2019-03-21 MED ORDER — INSULIN GLARGINE 100 UNIT/ML ~~LOC~~ SOLN
18.0000 [IU] | Freq: Every day | SUBCUTANEOUS | Status: DC
  Administered 2019-03-21: 18 [IU] via SUBCUTANEOUS
  Filled 2019-03-21: qty 0.18

## 2019-03-21 MED ORDER — INSULIN ASPART 100 UNIT/ML ~~LOC~~ SOLN
10.0000 [IU] | Freq: Three times a day (TID) | SUBCUTANEOUS | Status: DC
  Administered 2019-03-21 (×2): 10 [IU] via SUBCUTANEOUS

## 2019-03-21 MED ORDER — ACETAMINOPHEN 325 MG PO TABS: 650.0000 mg | ORAL_TABLET | ORAL | 0 refills | Status: DC | PRN

## 2019-03-21 MED ORDER — INSULIN GLARGINE 100 UNIT/ML ~~LOC~~ SOLN: 18.0000 [IU] | Freq: Every day | SUBCUTANEOUS | 11 refills | Status: DC

## 2019-03-21 NOTE — Discharge Instructions (Signed)
Intrauterine Growth Restriction  Intrauterine growth restriction (IUGR) is when a baby is not growing normally during pregnancy. A baby with IUGR is smaller than it should be and may weigh less than normal at birth. IUGR can result from a problem with the organ that supplies the unborn baby (fetus) with oxygen and nutrition (placenta). Usually, there is no way to prevent this type of problem. Babies with IUGR are at higher risk for early delivery and needing special (intensive) care after birth. What are the causes? The most common cause of IUGR is a problem with the placenta or umbilical cord that causes the fetus to get less oxygen or nutrition than needed. Other causes include:  The mother eating a very unhealthy diet (poor maternal nutrition).  Exposure to chemicals found in substances such as cigarettes, alcohol, and some drugs.  Some prescription medicines.  Other problems that develop in the womb (congenital birth defects).  Genetic disorders.  Infection.  Carrying more than one baby. What increases the risk? This condition is more likely to affect babies of mothers who:  Are over the age of 35 at the time of delivery.  Are younger than age 16 at the time of delivery.  Have medical conditions such as high blood pressure, preeclampsia, diabetes, heart or kidney disease, systemic lupus erythematosus, or anemia.  Live at a very high altitude during pregnancy.  Have a personal history or family history of: ? IUGR. ? A genetic disorder.  Take medicines during pregnancy that are related to congenital disabilities.  Come into contact with infected cat feces (toxoplasmosis).  Come into contact with chickenpox (varicella) or German measles (rubella).  Have or are at risk of getting an infectious disease such as syphilis, HIV, or herpes.  Eat an unhealthy diet during pregnancy.  Weigh less than 100 pounds.  Have had treatments to help her have children (infertility  treatments).  Use tobacco, drugs, or alcohol during pregnancy. What are the signs or symptoms? IUGR does not cause many symptoms. You might notice that your baby does not move or kick very often. Also, your belly may not be as big as expected for the stage of your pregnancy. How is this diagnosed? This condition is diagnosed with physical exams and prenatal exams. You may also have:  Fundal measurements to check the size of your uterus.  An ultrasound done to measure your baby's size compared to the size of other babies at the same stage of development (gestational age). Your health care provider will monitor your baby's growth with ultrasounds throughout pregnancy. You may also have tests to find the cause of IUGR. These may include:  Amniocentesis. This is a procedure that involves passing a needle into the uterus to collect a sample of fluid that surrounds the fetus (amniotic fluid). This may be done to check for signs of infection or congenital defects.  Tests to evaluate blood flow to your baby and placenta. How is this treated? In most cases, the goal of treatment is to treat the cause of IUGR. Your health care providers will monitor your pregnancy closely and help you manage your pregnancy. If your condition is caused by a placenta problem and your baby is not getting enough blood, you may need:  Medicine to start labor and deliver your baby early (induction).  Cesarean delivery, also called a C-section. In this procedure, your baby is delivered through an incision in your abdomen and uterus. Follow these instructions at home: Medicines  Take over-the-counter and prescription medicines   only as told by your health care provider. This includes vitamins and supplements.  Make sure that your health care provider knows about and approves of all the medicines, supplements, vitamins, eye drops, and creams that you use. General instructions  Eat a healthy diet that includes fresh fruits  and vegetables, lean proteins, whole grains, and calcium-rich foods such as milk, yogurt, and dark, leafy greens. Work with your health care provider or a dietitian to make sure that: ? You are getting enough nutrients. ? You are gaining enough weight.  Rest as needed. Try to get at least 8 hours of sleep every night.  Do not drink alcohol or use drugs.  Do not use any products that contain nicotine or tobacco, such as cigarettes and e-cigarettes. If you need help quitting, ask your health care provider.  Keep all follow-up visits as told by your health care provider. This is important. Get help right away if you:  Notice that your baby is moving less than usual or is not moving.  Have contractions that are 5 minutes or less apart, or that increase in frequency, intensity, or length.  Have signs and symptoms of infection, including a fever.  Have vaginal bleeding.  Have increased swelling in your legs, hands, or face.  Have vision changes, including seeing spots or having blurry or double vision.  Have a severe headache that does not go away.  Have sudden, sharp abdominal pain or low back pain.  Have an uncontrolled gush or trickle of fluid from your vagina. Summary  Intrauterine growth restriction (IUGR) is when a baby is not growing normally during pregnancy.  The most common cause of IUGR is a problem with the placenta or umbilical cord that causes the fetus to get less oxygen or nutrition than needed.  This condition is diagnosed with physical and prenatal exams. Your health care provider will monitor your baby's growth with ultrasounds throughout pregnancy.  Make sure that your health care provider knows about and approves of all the medicines, supplements, vitamins, eye drops, and creams that you use. This information is not intended to replace advice given to you by your health care provider. Make sure you discuss any questions you have with your health care  provider. Document Revised: 01/16/2017 Document Reviewed: 12/04/2016 Elsevier Patient Education  2020 [employer]  

## 2019-03-21 NOTE — Progress Notes (Signed)
Discharge instructions reviewed with patient by B. Mayford Knife, Charity fundraiser.  Follow-up appointments and medication emphasized to the patient.  Pt ambulated to car to drive herself home.  No equipment for home ordered at discharge.

## 2019-03-21 NOTE — Progress Notes (Signed)
Antenatal Progress Note  Brenda Casey is a 34 y.o. G3P0020 at [redacted]w[redacted]d who is admitted for IUGR, and Abnl dopplers.   Fetal presentation is cephalic.  Length of Stay:  3  Days  Subjective: Feels well. Patient reports the fetal movement as active. Patient reports uterine contraction  activity as none. Patient reports  vaginal bleeding as none. Patient describes fluid per vagina as None.  Vitals:  Blood pressure 140s-150s/90s,  temperature 99 F (37.2 C), temperature source Oral, resp. rate 18, height 5\' 4"  (1.626 m), weight 131 kg, last menstrual period 08/31/2018, SpO2 99 %, unknown if currently breastfeeding.  Physical Examination: General appearance - alert, well appearing, and in no distress Chest - normal effort Abdomen - gravid, non-tender Fundal Height:  size equals dates Extremities: Homans sign is negative, no sign of DVT  Membranes:intact  Fetal Monitoring:  Baseline: 145 bpm, Variability: Good {> 6 bpm), Accelerations: 10 x 10 and Decelerations: Absent  Imaging: 09/02/2018 MFM OB FOLLOW UP  Result Date: 03/18/2019 ----------------------------------------------------------------------  OBSTETRICS REPORT                       (Signed Final 03/18/2019 04:36 pm) ---------------------------------------------------------------------- Patient Info  ID #:       03/20/2019                          D.O.B.:  08/08/1985 (33 yrs)  Name:       [birth year]                Visit Date: 03/18/2019 03:49 pm ---------------------------------------------------------------------- Performed By  Performed By:     03/20/2019        Ref. Address:     [address]                    [place]                                                             [address]  Attending:        Elberta Fortis MD        [facility]  Referred By:      Brenda Casey  ---------------------------------------------------------------------- Orders   #  Description                          Code         Ordered By   1  [facility] MFM OB FOLLOW UP                  76816.01     Brenda Casey   2  04-17-1976 MFM UA CORD DOPPLER               [place]     Brenda Casey  ----------------------------------------------------------------------   #  Order #                    Accession #                 Episode #   1  [id number]                  [id number]                  [id number]   2  [id number]                  [id number]                  [id number]  ---------------------------------------------------------------------- Indications   Hypertension - Chronic/Pre-existing            O10.019   (labetalol)   [redacted] weeks gestation of pregnancy                Z3A.26   Pre-existing diabetes, type 2, in pregnancy,   O24.112   second trimester (insulin)   Maternal morbid obesity                        O99.210 E66.01   Hypothyroid                                    O99.280 E03.9   Fetal abnormality - other known or             O35.9XX0   suspected (cleft lip, 2vc)   Encounter for other antenatal screening        Z36.2   follow-up  ---------------------------------------------------------------------- Fetal Evaluation  Num Of Fetuses:         1  Fetal Heart Rate(bpm):  143  Cardiac Activity:       Observed  Presentation:           Cephalic  Placenta:               Fundal  Amniotic Fluid  AFI FV:      Within normal limits                              Largest Pocket(cm)                              5.7 ---------------------------------------------------------------------- Biometry  BPD:        58  mm     G. Age:  23w 5d        < 1  %    CI:        71.23   %    70 - 86                                                          FL/HC:      19.1   %    18.6 - 20.4  HC:      218.9  mm     G. Age:  23w 6d        < 1  %    HC/AC:  1.14        1.04 - 1.22  AC:      192.7  mm     G. Age:  24w 0d          1  %    FL/BPD:     72.1   %    71 -  87  FL:       41.8  mm     G. Age:  23w 4d        < 1  %    FL/AC:      21.7   %    20 - 24  LV:        3.7  mm  Est. FW:     633  gm      1 lb 6 oz    < 1  % ---------------------------------------------------------------------- OB History  Gravidity:    3         Term:   0        Prem:   0        SAB:   2  TOP:          0       Ectopic:  0        Living: 0 ---------------------------------------------------------------------- Gestational Age  LMP:           28w 3d        Date:  08/31/18                 EDD:   06/07/19  U/S Today:     23w 6d                                        EDD:   07/09/19  Best:          Brenda Casey 3d     Det. ByMarcella Dubs         EDD:   06/21/19                                      (11/12/18) ---------------------------------------------------------------------- Anatomy  Cranium:               Appears normal         LVOT:                   Previously seen  Cavum:                 Appears normal         Aortic Arch:            Previously seen  Ventricles:            Appears normal         Ductal Arch:            Previously seen  Choroid Plexus:        Previously seen        Diaphragm:              Appears normal  Cerebellum:            Appears normal         Stomach:                Appears normal, left  sided  Posterior Fossa:       Appears normal         Abdomen:                Previously seen  Nuchal Fold:           Previously seen        Abdominal Wall:         Appears nml (cord                                                                        insert, abd wall)  Face:                  Orbits and profile     Cord Vessels:           2 vessel cord,                         previously seen                                                                        absent left umb art  Lips:                  Abnormal, see          Kidneys:                Previously seen                         comments  Palate:                 Cleft palate           Bladder:                Appears normal                         suspected  Thoracic:              Previously seen        Spine:                  Previously seen  Heart:                 Previously seen        Upper Extremities:      Previously seen  RVOT:                  Not well visualized    Lower Extremities:      Previously seen  Other:  Technically difficult due to maternal habitus and fetal position. .          Hands and feet not well visualized. Fetus appears to be female prev          seen. ---------------------------------------------------------------------- Doppler - Fetal Vessels  Umbilical  Artery                                                            ADFV    RDFV                                                              Yes     Yes ---------------------------------------------------------------------- Cervix Uterus Adnexa  Cervix  Length:            4.6  cm.  Normal appearance by transabdominal scan. ---------------------------------------------------------------------- Impression  Patient with severe fetal growth restriction return for umbilical  artery Doppler studies.  On previous ultrasound absent end-  diastolic flow was detected and the patient was advised to go  to MAU for control of blood pressure.  Patient did not go to  the MAU.  Chronic hypertension: Patient takes labetalol 800 mg twice  daily and nifedipine XL 30 mg twice daily.  She also takes low-  dose aspirin prophylaxis.  Blood pressures today at our office  or 154/94 and 166/98 mmHg.  She does not have symptoms  of severe headache or visual disturbances or right upper  quadrant pain or vaginal bleeding.  Type 2 diabetes: Patient takes insulin glargine 15 units daily  (?  Morning).  She reports her fasting and postprandial levels  are within normal range.  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  Patient had opted not to have amniocentesis.  Fetal  echocardiography performed at  [facility], is reported  as normal.  On today's ultrasound, amniotic fluid is normal good fetal  activity seen.  The estimated fetal weight is at less than the  1st percentile (interval weight gain 193 g over 2 weeks).  Umbilical artery Doppler showed intermittent reversed end-  diastolic flow.  I explained worsening of Doppler studies and severely growth  restricted fetus.  I discussed and recommended antenatal  corticosteroids.  I informed her reversed end-diastolic flow  carries a poor prognosis and needs frequent monitoring.  Inpatient management till delivery may be recommended.  I emphasized on the importance of inpatient management to  control her blood pressure and evaluate diabetes.  Antenatal  corticosteroids also increase hyperglycemia and inpatient  management with sliding scale insulin is helpful in controlling  hyperglycemia.  Patient agreed to be hospitalized.  Discussed with Dr. Jolayne Panther and the patient will be going  over to [facility] specialty care now. ---------------------------------------------------------------------- Recommendations  -Admit to [facility] specialty care.  -Betamethasone course.  -Repeat umbilical artery Doppler studies on Monday.  -Management of hypertension and diabetes. ----------------------------------------------------------------------                  Noralee Space, MD Electronically Signed Final Report   03/18/2019 04:36 pm ----------------------------------------------------------------------  [place] MFM OB FOLLOW UP  Result Date: 03/03/2019 ----------------------------------------------------------------------  OBSTETRICS REPORT                       (Signed Final 03/03/2019 05:55 pm) ---------------------------------------------------------------------- Patient Info  ID #:       [id number]  D.O.B.:  [birth year] (33 yrs)  Name:       Brenda Casey                Visit Date: 03/03/2019 09:37 am  ---------------------------------------------------------------------- Performed By  Performed By:     Tomma Lightning             Ref. Address:     [address]                    [place]                                                             [address]  Attending:        Ma Rings MD         [facility]  Referred By:      [facility] ---------------------------------------------------------------------- Orders   #  Description                          Code         Ordered By   1  [place] MFM UA CORD DOPPLER               76820.02     Brenda Casey   2  [place] MFM OB FOLLOW UP                  [id number]     Brenda Casey  ----------------------------------------------------------------------   #  Order #                    Accession #                 Episode #   1  [id number]                  [id number]                  [id number]   2  [id number]                  [id number]                  [id number]  ---------------------------------------------------------------------- Indications   Hypertension - Chronic/Pre-existing            O10.019   (labetalol)   [redacted] weeks gestation of pregnancy                Z3A.24   Pre-existing diabetes, type 2, in pregnancy,   O24.112   second trimester (insulin)   Obesity complicating pregnancy, second         O99.212   trimester   Hypothyroid  O99.280 E03.9   Fetal abnormality - other known or             O35.9XX0   suspected (cleft lip, 2vc)   Encounter for other antenatal screening        Z36.2   follow-up  ---------------------------------------------------------------------- Fetal Evaluation  Num Of Fetuses:         1  Fetal Heart Rate(bpm):  129  Cardiac Activity:       Observed  Presentation:           Cephalic  Placenta:               Posterior Fundal  P. Cord Insertion:      Marginal insertion ??  Amniotic Fluid  AFI FV:      Within normal limits                               Largest Pocket(cm)                              4.61 ---------------------------------------------------------------------- Biometry  BPD:      53.2  mm     G. Age:  22w 1d          1  %    CI:        71.83   %    70 - 86                                                          FL/HC:      17.6   %    18.7 - 20.9  HC:      199.8  mm     G. Age:  22w 1d        < 1  %    HC/AC:      1.18        1.05 - 1.21  AC:      169.4  mm     G. Age:  22w 0d          1  %    FL/BPD:     66.2   %    71 - 87  FL:       35.2  mm     G. Age:  21w 1d        < 1  %    FL/AC:      20.8   %    20 - 24  HUM:      32.6  mm     G. Age:  21w 0d        < 5  %  LV:        2.7  mm  Est. FW:     440  gm           1 lb    < 1  % ---------------------------------------------------------------------- OB History  Gravidity:    3         Term:   0        Prem:   0        SAB:   2  TOP:  0       Ectopic:  0        Living: 0 ---------------------------------------------------------------------- Gestational Age  LMP:           26w 2d        Date:  08/31/18                 EDD:   06/07/19  U/S Today:     21w 6d                                        EDD:   07/08/19  Best:          24w 2d     Det. ByMarcella Dubs         EDD:   06/21/19                                      (11/12/18) ---------------------------------------------------------------------- Anatomy  Cranium:               Appears normal         LVOT:                   Previously seen  Cavum:                 Appears normal         Aortic Arch:            Previously seen  Ventricles:            Appears normal         Ductal Arch:            Previously seen  Choroid Plexus:        Previously seen        Diaphragm:              Appears normal  Cerebellum:            Previously seen        Stomach:                Appears normal, left                                                                        sided  Posterior Fossa:       Previously seen        Abdomen:                 Previously seen  Nuchal Fold:           Previously seen        Abdominal Wall:         Previously seen  Face:                  Orbits and profile     Cord Vessels:           2 vessel cord,  previously seen                                                                        absent left umb art  Lips:                  Abnormal, see          Kidneys:                Appear normal                         comments  Palate:                Cleft palate           Bladder:                Appears normal                         suspected  Thoracic:              Previously seen        Spine:                  Previously seen  Heart:                 Previously seen        Upper Extremities:      Previously seen  RVOT:                  Not well visualized    Lower Extremities:      Previously seen  Other:  Technically difficult due to maternal habitus and fetal position. .          Hands and feet not well visualized. Fetus appears to be female prev          seen. ---------------------------------------------------------------------- Doppler - Fetal Vessels  Umbilical Artery                                                            ADFV    RDFV                                                              Yes      No ---------------------------------------------------------------------- Cervix Uterus Adnexa  Cervix  Not visualized (advanced GA >24wks) ---------------------------------------------------------------------- Comments  This patient was seen for a follow-up exam due to fetal  growth restriction with absent end-diastolic flow noted during  her prior ultrasound exams.  The fetus is also suspected to  have bilateral cleft lip and palate.  The patient's pregnancy  has also been complicated by extremely elevated blood  pressures.  She is currently treated with a regimen of  labetalol  and nifedipine.  Adjustments have been made to her  current antihypertensive regimen.  I had recommended that  the  patient be evaluated in the hospital following her last  ultrasound exam.  The patient reports that she did not go to  the hospital to be evaluated.  On today's exam, her initial blood pressure was 153/105.  On  repeat it was 133/99.  The patient denies any signs or  symptoms of severe preeclampsia.  The overall EFW obtained today (440g) measures at less  than the 1st percentile for her gestational age.  There was  normal amniotic fluid noted today.  Fetal movements were  noted throughout today's ultrasound exam.  Doppler studies of the umbilical arteries performed today due  to fetal growth restriction continues to show absent end-  diastolic flow.  There were no signs of reversed end-diastolic  flow.  The increased risk of superimposed preeclampsia and an  IUFD due to her uncontrolled blood pressures was discussed  with the patient again today.  She was advised regarding my recommendation for her to be  evaluated in the hospital for preeclampsia and to make  adjustments to her antihypertensive regimen so that her  blood pressures could be better controlled.  The patient  declined to go to the hospital today.  She would prefer to  have her blood pressure medications adjusted as an  outpatient.  The patient was advised that as the fetus is now potentially  viable based on her gestational age and the fetal weight, we  will continue to follow her closely with weekly exams.  The  patient understands that a preterm delivery may be  recommended should reversed end-diastolic flow be noted on  her umbilical artery Doppler studies or should she develop  severe preeclampsia.  Preeclampsia precautions were  reviewed with the patient again today.  A course of antenatal  corticosteroids should be given should she be at risk for  delivery.  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  The patient was offered and declined an opportunity to  have a repeat cell free DNA test drawn today for screening of   fetal aneuploidy.  A follow-up exam was scheduled in 1 week.  A total of 30 minutes was spent counseling and coordinating  the care for this patient.  Greater than 50% of the time was  spent in direct face-to-face contact. ----------------------------------------------------------------------                   Ma Rings, MD Electronically Signed Final Report   03/03/2019 05:55 pm ----------------------------------------------------------------------  [place] MFM UA CORD DOPPLER  Result Date: 03/18/2019 ----------------------------------------------------------------------  OBSTETRICS REPORT                       (Signed Final 03/18/2019 04:36 pm) ---------------------------------------------------------------------- Patient Info  ID #:       [id number]                          D.O.B.:  September 19, 1985 (33 yrs)  Name:       Brenda Casey                Visit Date: 03/18/2019 03:49 pm ---------------------------------------------------------------------- Performed By  Performed By:     Ellin Saba        Ref. Address:     [address]  [place]                                                             [address]  Attending:        Noralee Space MD        [facility]  Referred By:      [facility] ---------------------------------------------------------------------- Orders   #  Description                          Code         Ordered By   1  [place] MFM OB FOLLOW UP                  [phone]     Brenda Casey   2  [place] MFM UA CORD DOPPLER               [id number]     Brenda Casey  ----------------------------------------------------------------------   #  Order #                    Accession #                 Episode #   1  [id number]                  [id number]                  [id number]   2  [id number]                  [id number]                  [id number]  ----------------------------------------------------------------------  Indications   Hypertension - Chronic/Pre-existing            O10.019   (labetalol)   [redacted] weeks gestation of pregnancy                Z3A.26   Pre-existing diabetes, type 2, in pregnancy,   O24.112   second trimester (insulin)   Maternal morbid obesity                        O99.210 E66.01   Hypothyroid                                    O99.280 E03.9   Fetal abnormality - other known or             O35.9XX0   suspected (cleft lip, 2vc)   Encounter for other antenatal screening        Z36.2   follow-up  ---------------------------------------------------------------------- Fetal Evaluation  Num Of Fetuses:         1  Fetal Heart Rate(bpm):  143  Cardiac Activity:       Observed  Presentation:           Cephalic  Placenta:               Fundal  Amniotic Fluid  AFI FV:      Within normal limits                              Largest Pocket(cm)                              5.7 ---------------------------------------------------------------------- Biometry  BPD:        58  mm     G. Age:  23w 5d        < 1  %    CI:        71.23   %    70 - 86                                                          FL/HC:      19.1   %    18.6 - 20.4  HC:      218.9  mm     G. Age:  23w 6d        < 1  %    HC/AC:      1.14        1.04 - 1.22  AC:      192.7  mm     G. Age:  24w 0d          1  %    FL/BPD:     72.1   %    71 - 87  FL:       41.8  mm     G. Age:  23w 4d        < 1  %    FL/AC:      21.7   %    20 - 24  LV:        3.7  mm  Est. FW:     633  gm      1 lb 6 oz    < 1  % ---------------------------------------------------------------------- OB History  Gravidity:    3         Term:   0        Prem:   0        SAB:   2  TOP:          0       Ectopic:  0        Living: 0 ---------------------------------------------------------------------- Gestational Age  LMP:           28w 3d        Date:  08/31/18                 EDD:   06/07/19  U/S Today:     23w 6d                                        EDD:  07/09/19  Best:          26w  3d     Det. ByMarcella Dubs         EDD:   06/21/19                                      (11/12/18) ---------------------------------------------------------------------- Anatomy  Cranium:               Appears normal         LVOT:                   Previously seen  Cavum:                 Appears normal         Aortic Arch:            Previously seen  Ventricles:            Appears normal         Ductal Arch:            Previously seen  Choroid Plexus:        Previously seen        Diaphragm:              Appears normal  Cerebellum:            Appears normal         Stomach:                Appears normal, left                                                                        sided  Posterior Fossa:       Appears normal         Abdomen:                Previously seen  Nuchal Fold:           Previously seen        Abdominal Wall:         Appears nml (cord                                                                        insert, abd wall)  Face:                  Orbits and profile     Cord Vessels:           2 vessel cord,                         previously seen  absent left umb art  Lips:                  Abnormal, see          Kidneys:                Previously seen                         comments  Palate:                Cleft palate           Bladder:                Appears normal                         suspected  Thoracic:              Previously seen        Spine:                  Previously seen  Heart:                 Previously seen        Upper Extremities:      Previously seen  RVOT:                  Not well visualized    Lower Extremities:      Previously seen  Other:  Technically difficult due to maternal habitus and fetal position. .          Hands and feet not well visualized. Fetus appears to be female prev          seen. ---------------------------------------------------------------------- Doppler - Fetal Vessels   Umbilical Artery                                                            ADFV    RDFV                                                              Yes     Yes ---------------------------------------------------------------------- Cervix Uterus Adnexa  Cervix  Length:            4.6  cm.  Normal appearance by transabdominal scan. ---------------------------------------------------------------------- Impression  Patient with severe fetal growth restriction return for umbilical  artery Doppler studies.  On previous ultrasound absent end-  diastolic flow was detected and the patient was advised to go  to MAU for control of blood pressure.  Patient did not go to  the MAU.  Chronic hypertension: Patient takes labetalol 800 mg twice  daily and nifedipine XL 30 mg twice daily.  She also takes low-  dose aspirin prophylaxis.  Blood pressures today at our office  or 154/94 and 166/98 mmHg.  She does not have symptoms  of severe headache or visual disturbances or right upper  quadrant pain or vaginal bleeding.  Type 2 diabetes: Patient takes insulin glargine 15 units daily  (?  Morning).  She reports her fasting and postprandial levels  are within normal range.  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  Patient had opted not to have amniocentesis.  [facility] [place], is reported  as normal.  On today's ultrasound, amniotic fluid is normal good fetal  activity seen.  The estimated fetal weight is at less than the  1st percentile (interval weight gain 193 g over 2 weeks).  Umbilical artery Doppler showed intermittent reversed end-  diastolic flow.  I explained worsening of Doppler studies and severely growth  restricted fetus.  I discussed and recommended antenatal  corticosteroids.  I informed her reversed end-diastolic flow  carries a poor prognosis and needs frequent monitoring.  Inpatient management till delivery may be recommended.  I emphasized on the  importance of inpatient management to  control her blood pressure and evaluate diabetes.  Antenatal  corticosteroids also increase hyperglycemia and inpatient  management with sliding scale insulin is helpful in controlling  hyperglycemia.  Patient agreed to be hospitalized.  Discussed with Dr. Jolayne Pantheronstant and the patient [facility]. ---------------------------------------------------------------------- Recommendations  [facility]  -Betamethasone course.  -Repeat umbilical artery Doppler studies on Monday.  -Management of hypertension and diabetes. ----------------------------------------------------------------------                  Noralee Spaceavi Shankar, MD Electronically Signed Final Report   03/18/2019 04:36 pm ----------------------------------------------------------------------  US MFM UA CORD DOPPLER  Result Date: 03/03/2019 ----------------------------------------------------------------------  OBSTETRICS REPORT                       (Signed Final 03/03/2019 05:55 pm) ---------------------------------------------------------------------- Patient Info  ID #:       [id number]                          D.O.B.:  [birth year] (33 yrs)  Name:       Brenda BayJALEESA Casey                Visit Date: 03/03/2019 09:37 am ---------------------------------------------------------------------- Performed By  Performed By:     Tomma Lightningevin Vics             Ref. Address:     [address]                    [place]                                                             [address]  Attending:        Ma RingsVictor Fang MD         [facility]  [facility]      [facility] ---------------------------------------------------------------------- Orders   #  Description  Code         Ordered By   1  [place] MFM UA CORD DOPPLER               [id number]     Brenda Casey   2  [place] MFM OB FOLLOW UP                   [id number]     Brenda Casey  ----------------------------------------------------------------------   #  Order #                    Accession #                 Episode #   1  [id number]   2  [id number]                  [id number]                  [id number]  ---------------------------------------------------------------------- Indications   Hypertension - Chronic/Pre-existing            O10.019   (labetalol)   [redacted] weeks gestation of pregnancy                Z3A.24   Pre-existing diabetes, type 2, in pregnancy,   O24.112   second trimester (insulin)   Obesity complicating pregnancy, second         O99.212   trimester   Hypothyroid                                    O99.280 E03.9   Fetal abnormality - other known or             O35.9XX0   suspected (cleft lip, 2vc)   Encounter for other antenatal screening        Z36.2   follow-up  ---------------------------------------------------------------------- Fetal Evaluation  Num Of Fetuses:         1  Fetal Heart Rate(bpm):  129  Cardiac Activity:       Observed  Presentation:           Cephalic  Placenta:               Posterior Fundal  P. Cord Insertion:      Marginal insertion ??  Amniotic Fluid  AFI FV:      Within normal limits                              Largest Pocket(cm)                              4.61 ---------------------------------------------------------------------- Biometry  BPD:      53.2  mm     G. Age:  22w 1d          1  %    CI:        71.83   %    70 - 86  FL/HC:      17.6   %    18.7 - 20.9  HC:      199.8  mm     G. Age:  22w 1d        < 1  %    HC/AC:      1.18        1.05 - 1.21  AC:      169.4  mm     G. Age:  22w 0d          1  %    FL/BPD:     66.2   %    71 - 87  FL:       35.2  mm     G. Age:  21w 1d        < 1  %    FL/AC:      20.8   %    20 - 24  HUM:      32.6  mm     G. Age:  21w 0d        < 5  %  LV:        2.7  mm  Est. FW:      440  gm           1 lb    < 1  % ---------------------------------------------------------------------- OB History  Gravidity:    3         Term:   0        Prem:   0        SAB:   2  TOP:          0       Ectopic:  0        Living: 0 ---------------------------------------------------------------------- Gestational Age  LMP:           26w 2d        Date:  08/31/18                 EDD:   06/07/19  U/S Today:     21w 6d                                        EDD:   07/08/19  Best:          24w 2d     Det. By:  Brenda DubsEarly Ultrasound         EDD:   06/21/19                                      (11/12/18) ---------------------------------------------------------------------- Anatomy  Cranium:               Appears normal         LVOT:                   Previously seen  Cavum:                 Appears normal         Aortic Arch:            Previously seen  Ventricles:            Appears normal         Ductal Arch:  Previously seen  Choroid Plexus:        Previously seen        Diaphragm:              Appears normal  Cerebellum:            Previously seen        Stomach:                Appears normal, left                                                                        sided  Posterior Fossa:       Previously seen        Abdomen:                Previously seen  Nuchal Fold:           Previously seen        Abdominal Wall:         Previously seen  Face:                  Orbits and profile     Cord Vessels:           2 vessel cord,                         previously seen                                                                        absent left umb art  Lips:                  Abnormal, see          Kidneys:                Appear normal                         comments  Palate:                Cleft palate           Bladder:                Appears normal                         suspected  Thoracic:              Previously seen        Spine:                  Previously seen  Heart:                 Previously  seen        Upper Extremities:      Previously seen  RVOT:  Not well visualized    Lower Extremities:      Previously seen  Other:  Technically difficult due to maternal habitus and fetal position. .          Hands and feet not well visualized. Fetus appears to be female prev          seen. ---------------------------------------------------------------------- Doppler - Fetal Vessels  Umbilical Artery                                                            ADFV    RDFV                                                              Yes      No ---------------------------------------------------------------------- Cervix Uterus Adnexa  Cervix  Not visualized (advanced GA >24wks) ---------------------------------------------------------------------- Comments  This patient was seen for a follow-up exam due to fetal  growth restriction with absent end-diastolic flow noted during  her prior ultrasound exams.  The fetus is also suspected to  have bilateral cleft lip and palate.  The patient's pregnancy  has also been complicated by extremely elevated blood  pressures.  She is currently treated with a regimen of  labetalol and nifedipine.  Adjustments have been made to her  current antihypertensive regimen.  I had recommended that  the patient be evaluated in the hospital following her last  ultrasound exam.  The patient reports that she did not go to  the hospital to be evaluated.  On today's exam, her initial blood pressure was 153/105.  On  repeat it was 133/99.  The patient denies any signs or  symptoms of severe preeclampsia.  The overall EFW obtained today (440g) measures at less  than the 1st percentile for her gestational age.  There was  normal amniotic fluid noted today.  Fetal movements were  noted throughout today's ultrasound exam.  Doppler studies of the umbilical arteries performed today due  to fetal growth restriction continues to show absent end-  diastolic flow.  There were no signs of  reversed end-diastolic  flow.  The increased risk of superimposed preeclampsia and an  IUFD due to her uncontrolled blood pressures was discussed  with the patient again today.  She was advised regarding my recommendation for her to be  evaluated in the hospital for preeclampsia and to make  adjustments to her antihypertensive regimen so that her  blood pressures could be better controlled.  The patient  declined to go to the hospital today.  She would prefer to  have her blood pressure medications adjusted as an  outpatient.  The patient was advised that as the fetus is now potentially  viable based on her gestational age and the fetal weight, we  will continue to follow her closely with weekly exams.  The  patient understands that a preterm delivery may be  recommended should reversed end-diastolic flow be noted on  her umbilical artery Doppler studies or should she develop  severe preeclampsia.  Preeclampsia precautions were  reviewed with the patient again today.  A course of antenatal  corticosteroids should be given should she be at risk for  delivery.  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  The patient was offered and declined an opportunity to  have a repeat cell free DNA test drawn today for screening of  fetal aneuploidy.  A follow-up exam was scheduled in 1 week.  A total of 30 minutes was spent counseling and coordinating  the care for this patient.  Greater than 50% of the time was  spent in direct face-to-face contact. ----------------------------------------------------------------------                   Ma Rings, MD Electronically Signed Final Report   03/03/2019 05:55 pm ----------------------------------------------------------------------  ASSESSMENT: Principal Problem:   Pregnancy affected by fetal growth restriction Active Problems:   Hypertension affecting pregnancy   Type 2 diabetes mellitus affecting pregnancy, antepartum   IUGR (intrauterine  growth restriction) affecting care of mother   PLAN: S/p  BMZ x 2 BP stable on current regimen Increased insulin to cover her elevated CBGs; Lantus 18 qAM and Regular 10 qac S/p NICU consult  Scheduled for UA dopplers today, will follow up results and manage accordingly. Routine antenatal care.   Jaynie Collins, MD, FACOG Obstetrician & Gynecologist, [facility] for [employer], [facility]

## 2019-03-21 NOTE — Discharge Summary (Signed)
OB Discharge Summary     Patient Name: Brenda Casey DOB: 21-Mar-1985 MRN: [id number]  Date of admission: 03/18/2019  Date of discharge: 03/21/2019  Admitting diagnosis: IUGR (intrauterine growth restriction) affecting care of mother [O36.5990] Intrauterine pregnancy: [redacted]w[redacted]d     Secondary diagnosis:  Principal Problem:   Pregnancy affected by fetal growth restriction Active Problems:   Hypertension affecting pregnancy   Type 2 diabetes mellitus affecting pregnancy, antepartum   IUGR (intrauterine growth restriction) affecting care of mother      Discharge diagnosis: same undelivered                                    Hospital course:  34 yo G3P0020 at [redacted]w[redacted]d admitted with IUGR, CHTN and type 2 DM for observations. Patient completed a course of BMZ on Saturday. Her BP has been better controlled with a maximum dose of labetalol and nifedipine (dosage was increased during this admission). CBGs have been relatively well controlled on Lantus despite the recent BMZ administration. Patient with UA dopplers today demonstrating persistent reverse end diastolic flow. Discussed with patient results and recommendations for inpatient observation until delivery at 32 weeks. Patient explains that she did not plan on staying inpatient past the weekend. She is willing to return to the hospital on Wednesday after fetal dopplers but desires to be discharged in order to place her affairs in order in anticipation of a prolonged admission. Patient understands the maternal fetal complications associated with uncontrolled HTN and IUGR including but not limited to risks of maternal death and/or fetal death. Patient verbalized understanding and all questions were answered. Patient will be scheduled for outpatient UA dopplers  Physical exam  Vitals:   03/21/19 0616 03/21/19 0756 03/21/19 1058 03/21/19 1100  BP: (!) 149/78 (!) 148/90 (!) 152/88 (!) 152/88  Pulse: 76 74 85 85  Resp: 18 18    Temp: 98.8 F (37.1  C) 98.7 F (37.1 C)    TempSrc: Oral Oral    SpO2: 99% 99%    Weight:      Height:       GENERAL: Well-developed, well-nourished female in no acute distress.  LUNGS: Clear to auscultation bilaterally.  HEART: Regular rate and rhythm. ABDOMEN: Soft, nontender, gravid EXTREMITIES: No cyanosis, clubbing, or edema, 2+ distal pulses.  FHT: baseline 150, mod variability, + 10x10 accels, no decels  Labs: Lab Results  Component Value Date   WBC 11.6 (H) 03/19/2019   HGB 13.9 03/19/2019   HCT 40.8 03/19/2019   MCV 79.8 (L) 03/19/2019   PLT 337 03/19/2019   CMP Latest Ref Rng & Units 03/19/2019  Glucose 70 - 99 mg/dL 315(Q)  BUN 6 - 20 mg/dL 11  Creatinine 0.08 - 6.76 mg/dL 1.95  Sodium 093 - 267 mmol/L 134(L)  Potassium 3.5 - 5.1 mmol/L 4.2  Chloride 98 - 111 mmol/L 104  CO2 22 - 32 mmol/L 20(L)  Calcium 8.9 - 10.3 mg/dL 9.2  Total Protein 6.5 - 8.1 g/dL 6.5  Total Bilirubin 0.3 - 1.2 mg/dL 0.4  Alkaline Phos 38 - 126 U/L 54  AST 15 - 41 U/L 23  ALT 0 - 44 U/L 30    Discharge instruction: per After Visit Summary and "Baby and Me Booklet".  After visit meds:  Allergies as of 03/21/2019      Reactions   Monosodium Glutamate Hives   Dairy Aid [lactase] Hives   Other Hives  MSG   Soy Allergy Hives   Lac Bovis Diarrhea      Medication List    STOP taking these medications   Basaglar KwikPen 100 UNIT/ML Sopn Replaced by: insulin glargine 100 UNIT/ML injection     TAKE these medications   acetaminophen 325 MG tablet Commonly known as: TYLENOL Take 2 tablets (650 mg total) by mouth every 4 (four) hours as needed (for pain scale < 4  OR  temperature  >/=  100.5 F).   aspirin 81 MG chewable tablet Chew 1 tablet (81 mg total) by mouth daily.   BD Pen Needle Nano U/F 32G X 4 MM Misc Generic drug: Insulin Pen Needle USE WITH INSULIN PEN AS DIRECTED   insulin glargine 100 UNIT/ML injection Commonly known as: LANTUS Inject 0.18 mLs (18 Units total) into the skin  daily. Start taking on: March 22, 2019 Replaces: Basaglar KwikPen 100 UNIT/ML Sopn   labetalol 200 MG tablet Commonly known as: NORMODYNE Take 4 tablets (800 mg total) by mouth 3 (three) times daily. What changed: when to take this   NIFEdipine 60 MG 24 hr tablet Commonly known as: ADALAT CC Take 1 tablet (60 mg total) by mouth 2 (two) times daily. What changed:   medication strength  additional instructions   prenatal multivitamin Tabs tablet Take 1 tablet by mouth at bedtime.       Diet: carb modified diet  Activity: Advance as tolerated. Pelvic rest for 6 weeks.    Follow up Appt: Future Appointments  Date Time Provider [place]  03/22/2019  8:15 AM [place]  03/25/2019  3:45 PM [place] [facility]  03/25/2019  3:45 PM [place] 2 [facility]      03/21/2019 Mora Bellman, MD

## 2019-03-22 ENCOUNTER — Other Ambulatory Visit: Payer: Managed Care, Other (non HMO)

## 2019-03-23 ENCOUNTER — Ambulatory Visit (HOSPITAL_COMMUNITY): Payer: Medicaid Other

## 2019-03-23 ENCOUNTER — Ambulatory Visit (HOSPITAL_COMMUNITY)
Admission: AD | Admit: 2019-03-23 | Payer: Medicaid Other | Source: Home / Self Care | Attending: Obstetrics and Gynecology | Admitting: Obstetrics and Gynecology

## 2019-03-25 ENCOUNTER — Ambulatory Visit (HOSPITAL_COMMUNITY)
Admission: RE | Admit: 2019-03-25 | Discharge: 2019-03-25 | Disposition: A | Discharge status: Home / Self Care | Payer: Medicaid Other | Source: Ambulatory Visit | Attending: Obstetrics and Gynecology | Admitting: Obstetrics and Gynecology

## 2019-03-25 ENCOUNTER — Other Ambulatory Visit: Payer: Self-pay

## 2019-03-25 ENCOUNTER — Encounter (HOSPITAL_COMMUNITY): Payer: Self-pay

## 2019-03-25 ENCOUNTER — Ambulatory Visit (HOSPITAL_COMMUNITY): Payer: Medicaid Other | Admitting: *Deleted

## 2019-03-25 DIAGNOSIS — O99212 Obesity complicating pregnancy, second trimester: Secondary | ICD-10-CM | POA: Diagnosis not present

## 2019-03-25 DIAGNOSIS — O24112 Pre-existing diabetes mellitus, type 2, in pregnancy, second trimester: Secondary | ICD-10-CM | POA: Diagnosis not present

## 2019-03-25 DIAGNOSIS — O24119 Pre-existing diabetes mellitus, type 2, in pregnancy, unspecified trimester: Secondary | ICD-10-CM | POA: Insufficient documentation

## 2019-03-25 DIAGNOSIS — O0990 Supervision of high risk pregnancy, unspecified, unspecified trimester: Secondary | ICD-10-CM

## 2019-03-25 DIAGNOSIS — Z3A27 27 weeks gestation of pregnancy: Secondary | ICD-10-CM

## 2019-03-25 DIAGNOSIS — O10012 Pre-existing essential hypertension complicating pregnancy, second trimester: Secondary | ICD-10-CM | POA: Diagnosis not present

## 2019-03-25 DIAGNOSIS — O162 Unspecified maternal hypertension, second trimester: Secondary | ICD-10-CM

## 2019-03-25 DIAGNOSIS — O365990 Maternal care for other known or suspected poor fetal growth, unspecified trimester, not applicable or unspecified: Secondary | ICD-10-CM

## 2019-03-25 NOTE — Consult Note (Signed)
MFM Note  This patient was seen for umbilical artery Doppler studies due to a severely growth restricted fetus and uncontrolled blood pressures.  The patient was hospitalized last week due to her extremely elevated blood pressures and the reversed end-diastolic flow noted on her umbilical artery Doppler studies.  She received a complete course of antenatal corticosteroids in the hospital.  Her blood pressures were mildly controlled using the maximum doses of nifedipine and labetalol.  It was recommended that the patient be hospitalized until delivery due to her elevated blood pressures and the reversed end-diastolic flow noted on her umbilical artery Doppler studies.  However, the patient stated that she she needed some time at home to get her affairs in order.  The patient's blood pressure is in our office today were 181/107 and 175/106.  The patient reports that she is taking both nifedipine and labetalol.  She reports that her blood pressures at home have been in the 140s over 90s range.  She denies feeling any signs or symptoms of severe preeclampsia.  Doppler studies of the umbilical arteries performed today continues to show persistent reversed end-diastolic flow.  There was normal amniotic fluid noted.  The fetus was in the breech presentation today.  Due to her extremely elevated blood pressures and the persistent reversed end-diastolic flow noted today, I advised the patient that it is my strong recommendation that she be readmitted to the hospital and possibly even be delivered.  I advised the patient regarding the increased risk of adverse maternal and fetal consequences including maternal stroke, placental abruption, IUFD, and even maternal death should her blood pressures remain this high.  The patient stated that her blood pressures at home have been in the 140s over 90s range and therefore she does not believe that she needs to stay in the hospital.  I advised the patient that even if her  blood pressures are under control, there is still a significant risk of fetal death due to the persistent reversed end-diastolic flow noted on her umbilical artery Dopplers.  I emphasized to her that this degree of reversed end-diastolic flow is not commonly noted and there is a strong possibility of a fetal demise occurring in the near future.  Therefore, I would encourage her to be hospitalized and remain in the hospital until delivery.  The patient stated that she will have to think about it.  I encouraged her to go into the hospital if not today, then within the next day or two.   Should she not get admitted to the hospital, I encouraged her to continue taking all of her blood pressure medications and to continue monitoring her blood pressures and fetal movements at home.  We will plan on seeing her 3 times a week for umbilical artery Doppler studies and blood pressure checks.  A total of 30 minutes was spent counseling and coordinating the care for this patient.  Greater than 50% of the time was spent in direct face-to-face contact.

## 2019-03-28 ENCOUNTER — Ambulatory Visit (HOSPITAL_COMMUNITY): Admission: RE | Admit: 2019-03-28 | Payer: Medicaid Other | Source: Ambulatory Visit

## 2019-03-28 ENCOUNTER — Other Ambulatory Visit (HOSPITAL_COMMUNITY): Payer: Self-pay | Admitting: *Deleted

## 2019-03-28 ENCOUNTER — Ambulatory Visit (HOSPITAL_COMMUNITY): Payer: Medicaid Other

## 2019-03-28 ENCOUNTER — Encounter (HOSPITAL_COMMUNITY): Payer: Self-pay

## 2019-03-28 ENCOUNTER — Telehealth: Payer: Self-pay | Admitting: Family Medicine

## 2019-03-28 DIAGNOSIS — O365920 Maternal care for other known or suspected poor fetal growth, second trimester, not applicable or unspecified: Secondary | ICD-10-CM

## 2019-03-28 DIAGNOSIS — O365930 Maternal care for other known or suspected poor fetal growth, third trimester, not applicable or unspecified: Secondary | ICD-10-CM

## 2019-03-28 NOTE — Telephone Encounter (Signed)
Attempted to contact patient to get her scheduled for an ob appointment. No answer, left voicemail for patient to give the office a call back. mychart message sent.

## 2019-03-30 ENCOUNTER — Telehealth: Payer: Self-pay | Admitting: Family Medicine

## 2019-03-30 ENCOUNTER — Ambulatory Visit (HOSPITAL_COMMUNITY): Admission: RE | Admit: 2019-03-30 | Payer: Medicaid Other | Source: Ambulatory Visit

## 2019-03-30 ENCOUNTER — Ambulatory Visit (HOSPITAL_COMMUNITY): Payer: Medicaid Other | Attending: Obstetrics and Gynecology

## 2019-03-30 NOTE — Telephone Encounter (Signed)
Attempted to contact patient again to get her scheduled for an ob appointment. No answer, left voicemail for patient to give the office a call back to be scheduled. A note was placed on her u/s appointment on 2/10 for her to come over to the office to make an ob appointment.

## 2019-04-01 ENCOUNTER — Telehealth (HOSPITAL_COMMUNITY): Payer: Self-pay | Admitting: *Deleted

## 2019-04-01 ENCOUNTER — Ambulatory Visit (HOSPITAL_COMMUNITY): Admission: RE | Admit: 2019-04-01 | Payer: Medicaid Other | Source: Ambulatory Visit

## 2019-04-01 ENCOUNTER — Ambulatory Visit (HOSPITAL_COMMUNITY): Payer: Medicaid Other | Attending: Obstetrics and Gynecology

## 2019-04-01 NOTE — Telephone Encounter (Signed)
TC to patient regarding missed appointments in MFM this week. Left message and requested her to return our call to schedule appointments for next week. Instructed patient to go to MAU if she experienced decreased fetal movement.

## 2019-04-04 ENCOUNTER — Telehealth: Payer: Self-pay

## 2019-04-04 NOTE — Telephone Encounter (Signed)
Per Dr. Macon Large she wants to make sure that pt is logging CBG's in [employer].  Called and left message if she could please give the office a call back or respond to her MyChart message.

## 2019-04-05 ENCOUNTER — Encounter: Payer: Self-pay | Admitting: Advanced Practice Midwife

## 2019-04-05 DIAGNOSIS — Z148 Genetic carrier of other disease: Secondary | ICD-10-CM | POA: Insufficient documentation

## 2019-04-14 ENCOUNTER — Telehealth: Payer: Self-pay | Admitting: Obstetrics and Gynecology

## 2019-04-14 ENCOUNTER — Other Ambulatory Visit: Payer: Self-pay | Admitting: Family Medicine

## 2019-04-14 NOTE — Telephone Encounter (Signed)
Called the patient and left a voicemail informing of the upcoming appointment per the request of Dr. Adrian Blackwater. The message stated the time and date of the upcoming appointment. If the time and date is not convinent for the patient please give [place] a call back to reschedule. Also sending the patient a mychart message and mailing a letter.

## 2019-04-21 ENCOUNTER — Encounter: Payer: Medicaid Other | Admitting: Obstetrics and Gynecology

## 2019-04-21 ENCOUNTER — Encounter: Payer: Self-pay | Admitting: Obstetrics and Gynecology

## 2019-04-21 ENCOUNTER — Other Ambulatory Visit: Payer: Medicaid Other

## 2019-04-24 NOTE — Progress Notes (Signed)
Patient did not keep her high risk OB appointment for 04/21/2019.  Cornelia Copa MD Attending Center for [employer] [facility])

## 2019-05-03 ENCOUNTER — Encounter: Payer: Medicaid Other | Admitting: Obstetrics and Gynecology

## 2019-05-03 ENCOUNTER — Other Ambulatory Visit: Payer: Medicaid Other

## 2019-05-09 ENCOUNTER — Inpatient Hospital Stay (HOSPITAL_COMMUNITY): Payer: Medicaid Other | Admitting: Anesthesiology

## 2019-05-09 ENCOUNTER — Other Ambulatory Visit: Payer: Self-pay

## 2019-05-09 ENCOUNTER — Inpatient Hospital Stay (HOSPITAL_BASED_OUTPATIENT_CLINIC_OR_DEPARTMENT_OTHER): Payer: Medicaid Other

## 2019-05-09 ENCOUNTER — Inpatient Hospital Stay (HOSPITAL_COMMUNITY)
Admission: AD | Admit: 2019-05-09 | Discharge: 2019-05-12 | DRG: 768 | Disposition: A | Discharge status: Home / Self Care | Payer: Medicaid Other | Attending: Obstetrics and Gynecology | Admitting: Obstetrics and Gynecology

## 2019-05-09 ENCOUNTER — Encounter (HOSPITAL_COMMUNITY): Payer: Self-pay | Admitting: Obstetrics and Gynecology

## 2019-05-09 DIAGNOSIS — O2412 Pre-existing diabetes mellitus, type 2, in childbirth: Secondary | ICD-10-CM | POA: Diagnosis present

## 2019-05-09 DIAGNOSIS — O24119 Pre-existing diabetes mellitus, type 2, in pregnancy, unspecified trimester: Secondary | ICD-10-CM | POA: Diagnosis present

## 2019-05-09 DIAGNOSIS — Z3A33 33 weeks gestation of pregnancy: Secondary | ICD-10-CM | POA: Diagnosis not present

## 2019-05-09 DIAGNOSIS — O99214 Obesity complicating childbirth: Secondary | ICD-10-CM | POA: Diagnosis present

## 2019-05-09 DIAGNOSIS — Z3A34 34 weeks gestation of pregnancy: Secondary | ICD-10-CM | POA: Diagnosis not present

## 2019-05-09 DIAGNOSIS — Z794 Long term (current) use of insulin: Secondary | ICD-10-CM

## 2019-05-09 DIAGNOSIS — O365990 Maternal care for other known or suspected poor fetal growth, unspecified trimester, not applicable or unspecified: Secondary | ICD-10-CM | POA: Diagnosis present

## 2019-05-09 DIAGNOSIS — O328XX0 Maternal care for other malpresentation of fetus, not applicable or unspecified: Secondary | ICD-10-CM | POA: Diagnosis present

## 2019-05-09 DIAGNOSIS — O365930 Maternal care for other known or suspected poor fetal growth, third trimester, not applicable or unspecified: Secondary | ICD-10-CM | POA: Diagnosis present

## 2019-05-09 DIAGNOSIS — E113393 Type 2 diabetes mellitus with moderate nonproliferative diabetic retinopathy without macular edema, bilateral: Secondary | ICD-10-CM | POA: Diagnosis present

## 2019-05-09 DIAGNOSIS — O364XX0 Maternal care for intrauterine death, not applicable or unspecified: Principal | ICD-10-CM | POA: Diagnosis present

## 2019-05-09 DIAGNOSIS — O021 Missed abortion: Secondary | ICD-10-CM

## 2019-05-09 DIAGNOSIS — O114 Pre-existing hypertension with pre-eclampsia, complicating childbirth: Secondary | ICD-10-CM | POA: Diagnosis present

## 2019-05-09 DIAGNOSIS — O1413 Severe pre-eclampsia, third trimester: Secondary | ICD-10-CM

## 2019-05-09 DIAGNOSIS — O1493 Unspecified pre-eclampsia, third trimester: Secondary | ICD-10-CM | POA: Diagnosis not present

## 2019-05-09 DIAGNOSIS — Z20822 Contact with and (suspected) exposure to covid-19: Secondary | ICD-10-CM | POA: Diagnosis present

## 2019-05-09 DIAGNOSIS — O1410 Severe pre-eclampsia, unspecified trimester: Secondary | ICD-10-CM

## 2019-05-09 DIAGNOSIS — O169 Unspecified maternal hypertension, unspecified trimester: Secondary | ICD-10-CM | POA: Diagnosis present

## 2019-05-09 DIAGNOSIS — Z8639 Personal history of other endocrine, nutritional and metabolic disease: Secondary | ICD-10-CM

## 2019-05-09 DIAGNOSIS — O1002 Pre-existing essential hypertension complicating childbirth: Secondary | ICD-10-CM | POA: Diagnosis present

## 2019-05-09 DIAGNOSIS — O411230 Chorioamnionitis, third trimester, not applicable or unspecified: Secondary | ICD-10-CM | POA: Diagnosis present

## 2019-05-09 DIAGNOSIS — O0990 Supervision of high risk pregnancy, unspecified, unspecified trimester: Secondary | ICD-10-CM

## 2019-05-09 DIAGNOSIS — O368390 Maternal care for abnormalities of the fetal heart rate or rhythm, unspecified trimester, not applicable or unspecified: Secondary | ICD-10-CM

## 2019-05-09 DIAGNOSIS — IMO0002 Reserved for concepts with insufficient information to code with codable children: Secondary | ICD-10-CM

## 2019-05-09 HISTORY — DX: Severe pre-eclampsia, unspecified trimester: O14.10

## 2019-05-09 HISTORY — DX: Missed abortion: O02.1

## 2019-05-09 HISTORY — DX: Maternal care for intrauterine death, not applicable or unspecified: O36.4XX0

## 2019-05-09 HISTORY — DX: Reserved for concepts with insufficient information to code with codable children: IMO0002

## 2019-05-09 LAB — GLUCOSE, CAPILLARY
Glucose-Capillary: 260 mg/dL — ABNORMAL HIGH (ref 70–99)
Glucose-Capillary: 161 mg/dL — ABNORMAL HIGH (ref 70–99)
Glucose-Capillary: 150 mg/dL — ABNORMAL HIGH (ref 70–99)

## 2019-05-09 LAB — RAPID URINE DRUG SCREEN, HOSP PERFORMED
Amphetamines: NOT DETECTED
Barbiturates: NOT DETECTED
Benzodiazepines: NOT DETECTED
Cocaine: NOT DETECTED
Opiates: NOT DETECTED
Tetrahydrocannabinol: NOT DETECTED

## 2019-05-09 LAB — COMPREHENSIVE METABOLIC PANEL
ALT: 16 U/L (ref 0–44)
ALT: 18 U/L (ref 0–44)
AST: 19 U/L (ref 15–41)
AST: 19 U/L (ref 15–41)
Albumin: 3.2 g/dL — ABNORMAL LOW (ref 3.5–5.0)
Albumin: 3.2 g/dL — ABNORMAL LOW (ref 3.5–5.0)
Alkaline Phosphatase: 67 U/L (ref 38–126)
Alkaline Phosphatase: 77 U/L (ref 38–126)
Anion gap: 9 (ref 5–15)
Anion gap: 12 (ref 5–15)
BUN: 8 mg/dL (ref 6–20)
BUN: 9 mg/dL (ref 6–20)
CO2: 21 mmol/L — ABNORMAL LOW (ref 22–32)
CO2: 23 mmol/L (ref 22–32)
Calcium: 9.2 mg/dL (ref 8.9–10.3)
Calcium: 8.5 mg/dL — ABNORMAL LOW (ref 8.9–10.3)
Chloride: 108 mmol/L (ref 98–111)
Chloride: 100 mmol/L (ref 98–111)
Creatinine, Ser: 0.85 mg/dL (ref 0.44–1.00)
Creatinine, Ser: 0.97 mg/dL (ref 0.44–1.00)
GFR calc Af Amer: >60 mL/min (ref >60)
GFR calc Af Amer: >60 mL/min (ref >60)
GFR calc non Af Amer: >60 mL/min (ref >60)
GFR calc non Af Amer: >60 mL/min (ref >60)
Glucose, Bld: 197 mg/dL — ABNORMAL HIGH (ref 70–99)
Glucose, Bld: 142 mg/dL — ABNORMAL HIGH (ref 70–99)
Potassium: 4.1 mmol/L (ref 3.5–5.1)
Potassium: 4 mmol/L (ref 3.5–5.1)
Sodium: 138 mmol/L (ref 135–145)
Sodium: 135 mmol/L (ref 135–145)
Total Bilirubin: 0.4 mg/dL (ref 0.3–1.2)
Total Bilirubin: 0.6 mg/dL (ref 0.3–1.2)
Total Protein: 7.3 g/dL (ref 6.5–8.1)
Total Protein: 7.3 g/dL (ref 6.5–8.1)

## 2019-05-09 LAB — URINALYSIS, ROUTINE W REFLEX MICROSCOPIC
Bacteria, UA: NONE SEEN
Bilirubin Urine: NEGATIVE
Glucose, UA: >=500 mg/dL — AB
Ketones, ur: 20 mg/dL — AB
Leukocytes,Ua: NEGATIVE
Nitrite: NEGATIVE
Protein, ur: 30 mg/dL — AB
Specific Gravity, Urine: 1.017 (ref 1.005–1.030)
pH: 6 (ref 5.0–8.0)

## 2019-05-09 LAB — PROTEIN / CREATININE RATIO, URINE
Creatinine, Urine: 103.99 mg/dL
Protein Creatinine Ratio: 0.39 mg/mg{Cre} — ABNORMAL HIGH (ref 0.00–0.15)
Total Protein, Urine: 41 mg/dL

## 2019-05-09 LAB — CBC
HCT: 43.8 % (ref 36.0–46.0)
HCT: 45 % (ref 36.0–46.0)
Hemoglobin: 14.2 g/dL (ref 12.0–15.0)
Hemoglobin: 14.9 g/dL (ref 12.0–15.0)
MCH: 26.7 pg (ref 26.0–34.0)
MCH: 26.9 pg (ref 26.0–34.0)
MCHC: 32.4 g/dL (ref 30.0–36.0)
MCHC: 33.1 g/dL (ref 30.0–36.0)
MCV: 82.3 fL (ref 80.0–100.0)
MCV: 81.2 fL (ref 80.0–100.0)
Platelets: 341 10*3/uL (ref 150–400)
Platelets: 351 10*3/uL (ref 150–400)
RBC: 5.32 MIL/uL — ABNORMAL HIGH (ref 3.87–5.11)
RBC: 5.54 MIL/uL — ABNORMAL HIGH (ref 3.87–5.11)
RDW: 13.5 % (ref 11.5–15.5)
RDW: 13.9 % (ref 11.5–15.5)
WBC: 9.6 10*3/uL (ref 4.0–10.5)
WBC: 16.3 10*3/uL — ABNORMAL HIGH (ref 4.0–10.5)
nRBC: 0 % (ref 0.0–0.2)
nRBC: 0 % (ref 0.0–0.2)

## 2019-05-09 LAB — RESPIRATORY PANEL BY RT PCR (FLU A&B, COVID)
Influenza A by PCR: NEGATIVE
Influenza B by PCR: NEGATIVE
SARS Coronavirus 2 by RT PCR: NEGATIVE

## 2019-05-09 LAB — TSH: TSH: 1.383 u[IU]/mL (ref 0.350–4.500)

## 2019-05-09 LAB — HEMOGLOBIN A1C
Hgb A1c MFr Bld: 8 % — ABNORMAL HIGH (ref 4.8–5.6)
Mean Plasma Glucose: 182.9 mg/dL

## 2019-05-09 LAB — SAVE SMEAR(SSMR), FOR PROVIDER SLIDE REVIEW

## 2019-05-09 MED ORDER — EPHEDRINE 5 MG/ML INJ: 10.0000 mg | INTRAVENOUS | Status: DC | PRN

## 2019-05-09 MED ORDER — PHENYLEPHRINE 40 MCG/ML (10ML) SYRINGE FOR IV PUSH (FOR BLOOD PRESSURE SUPPORT): 80.0000 ug | PREFILLED_SYRINGE | INTRAVENOUS | Status: DC | PRN

## 2019-05-09 MED ORDER — LABETALOL HCL 5 MG/ML IV SOLN
80.0000 mg | INTRAVENOUS | Status: DC | PRN
  Administered 2019-05-09: 80 mg via INTRAVENOUS
  Filled 2019-05-09: qty 16

## 2019-05-09 MED ORDER — LABETALOL HCL 200 MG PO TABS
400.0000 mg | ORAL_TABLET | Freq: Three times a day (TID) | ORAL | Status: DC
  Administered 2019-05-10 (×2): 400 mg via ORAL
  Filled 2019-05-09 (×2): qty 2

## 2019-05-09 MED ORDER — OXYCODONE-ACETAMINOPHEN 5-325 MG PO TABS: 2.0000 | ORAL_TABLET | ORAL | Status: DC | PRN

## 2019-05-09 MED ORDER — BUTORPHANOL TARTRATE 1 MG/ML IJ SOLN: 2.0000 mg | Freq: Once | INTRAMUSCULAR | Status: DC

## 2019-05-09 MED ORDER — SOD CITRATE-CITRIC ACID 500-334 MG/5ML PO SOLN
30.0000 mL | ORAL | Status: DC | PRN
  Administered 2019-05-10: 30 mL via ORAL
  Filled 2019-05-09: qty 30

## 2019-05-09 MED ORDER — LACTATED RINGERS IV SOLN: INTRAVENOUS | Status: DC

## 2019-05-09 MED ORDER — OXYTOCIN BOLUS FROM INFUSION
500.0000 mL | Freq: Once | INTRAVENOUS | Status: AC
  Administered 2019-05-10: 500 mL via INTRAVENOUS

## 2019-05-09 MED ORDER — NITROGLYCERIN 0.4 MG/SPRAY TL SOLN
Status: AC
  Filled 2019-05-09: qty 4.9

## 2019-05-09 MED ORDER — INSULIN ASPART 100 UNIT/ML ~~LOC~~ SOLN
0.0000 [IU] | Freq: Three times a day (TID) | SUBCUTANEOUS | Status: DC
  Administered 2019-05-09: 11 [IU] via SUBCUTANEOUS
  Administered 2019-05-09: 3 [IU] via SUBCUTANEOUS
  Administered 2019-05-09: 4 [IU] via SUBCUTANEOUS

## 2019-05-09 MED ORDER — MISOPROSTOL 200 MCG PO TABS: 400.0000 ug | ORAL_TABLET | ORAL | Status: DC

## 2019-05-09 MED ORDER — LIDOCAINE HCL (PF) 1 % IJ SOLN: 30.0000 mL | INTRAMUSCULAR | Status: DC | PRN

## 2019-05-09 MED ORDER — OXYTOCIN 40 UNITS IN NORMAL SALINE INFUSION - SIMPLE MED
2.5000 [IU]/h | INTRAVENOUS | Status: DC
  Filled 2019-05-09 (×2): qty 1000

## 2019-05-09 MED ORDER — ACETAMINOPHEN 325 MG PO TABS
650.0000 mg | ORAL_TABLET | ORAL | Status: DC | PRN
  Administered 2019-05-10: 650 mg via ORAL
  Filled 2019-05-09: qty 2

## 2019-05-09 MED ORDER — LABETALOL HCL 5 MG/ML IV SOLN
20.0000 mg | INTRAVENOUS | Status: DC | PRN
  Administered 2019-05-09 (×2): 20 mg via INTRAVENOUS
  Filled 2019-05-09: qty 4

## 2019-05-09 MED ORDER — PROMETHAZINE HCL 25 MG/ML IJ SOLN: 25.0000 mg | Freq: Once | INTRAMUSCULAR | Status: DC

## 2019-05-09 MED ORDER — FENTANYL-BUPIVACAINE-NACL 0.5-0.125-0.9 MG/250ML-% EP SOLN
12.0000 mL/h | EPIDURAL | Status: DC | PRN
  Administered 2019-05-10: 12 mL/h via EPIDURAL
  Filled 2019-05-09 (×2): qty 250

## 2019-05-09 MED ORDER — HYDRALAZINE HCL 20 MG/ML IJ SOLN: 5.0000 mg | INTRAMUSCULAR | Status: DC | PRN

## 2019-05-09 MED ORDER — OXYCODONE-ACETAMINOPHEN 5-325 MG PO TABS: 1.0000 | ORAL_TABLET | ORAL | Status: DC | PRN

## 2019-05-09 MED ORDER — MAGNESIUM SULFATE BOLUS VIA INFUSION
4.0000 g | Freq: Once | INTRAVENOUS | Status: AC
  Filled 2019-05-09: qty 1000

## 2019-05-09 MED ORDER — MAGNESIUM SULFATE 40 GM/1000ML IV SOLN
2.0000 g/h | INTRAVENOUS | Status: DC
  Administered 2019-05-09 – 2019-05-10 (×2): 2 g/h via INTRAVENOUS
  Filled 2019-05-09: qty 1000

## 2019-05-09 MED ORDER — LABETALOL HCL 5 MG/ML IV SOLN: 40.0000 mg | INTRAVENOUS | Status: DC | PRN

## 2019-05-09 MED ORDER — ACETAMINOPHEN 500 MG PO TABS
1000.0000 mg | ORAL_TABLET | Freq: Once | ORAL | Status: AC
  Administered 2019-05-09: 1000 mg via ORAL
  Filled 2019-05-09: qty 2

## 2019-05-09 MED ORDER — MISOPROSTOL 200 MCG PO TABS
600.0000 ug | ORAL_TABLET | ORAL | Status: DC
  Administered 2019-05-09 – 2019-05-10 (×2): 600 ug via ORAL
  Filled 2019-05-09 (×2): qty 3

## 2019-05-09 MED ORDER — MISOPROSTOL 200 MCG PO TABS
600.0000 ug | ORAL_TABLET | Freq: Once | ORAL | Status: AC
  Administered 2019-05-09: 600 ug via ORAL
  Filled 2019-05-09: qty 3

## 2019-05-09 MED ORDER — NITROGLYCERIN IN D5W 200-5 MCG/ML-% IV SOLN
INTRAVENOUS | Status: AC
  Filled 2019-05-09: qty 250

## 2019-05-09 MED ORDER — DIPHENHYDRAMINE HCL 50 MG/ML IJ SOLN: 12.5000 mg | INTRAMUSCULAR | Status: DC | PRN

## 2019-05-09 MED ORDER — LABETALOL HCL 5 MG/ML IV SOLN
20.0000 mg | INTRAVENOUS | Status: DC | PRN
  Filled 2019-05-09: qty 4

## 2019-05-09 MED ORDER — FENTANYL CITRATE (PF) 100 MCG/2ML IJ SOLN
100.0000 ug | INTRAMUSCULAR | Status: DC | PRN
  Administered 2019-05-09 (×2): 100 ug via INTRAVENOUS
  Administered 2019-05-10 (×2): 50 ug via INTRAVENOUS
  Filled 2019-05-09 (×2): qty 2

## 2019-05-09 MED ORDER — PHENYLEPHRINE 40 MCG/ML (10ML) SYRINGE FOR IV PUSH (FOR BLOOD PRESSURE SUPPORT)
80.0000 ug | PREFILLED_SYRINGE | INTRAVENOUS | Status: DC | PRN
  Filled 2019-05-09 (×2): qty 10

## 2019-05-09 MED ORDER — LACTATED RINGERS IV SOLN: 500.0000 mL | Freq: Once | INTRAVENOUS | Status: DC

## 2019-05-09 MED ORDER — SODIUM CHLORIDE 0.9 % IV SOLN
2.0000 g | Freq: Four times a day (QID) | INTRAVENOUS | Status: DC
  Administered 2019-05-09 – 2019-05-10 (×3): 2 g via INTRAVENOUS
  Filled 2019-05-09 (×3): qty 2000

## 2019-05-09 MED ORDER — LACTATED RINGERS IV SOLN: 500.0000 mL | INTRAVENOUS | Status: DC | PRN

## 2019-05-09 MED ORDER — GENTAMICIN SULFATE 40 MG/ML IJ SOLN
5.0000 mg/kg | INTRAVENOUS | Status: DC
  Administered 2019-05-10: 430 mg via INTRAVENOUS
  Filled 2019-05-09 (×2): qty 10.75

## 2019-05-09 MED ORDER — ONDANSETRON HCL 4 MG/2ML IJ SOLN
4.0000 mg | Freq: Four times a day (QID) | INTRAMUSCULAR | Status: DC | PRN
  Administered 2019-05-10: 4 mg via INTRAVENOUS

## 2019-05-09 MED ORDER — HYDRALAZINE HCL 20 MG/ML IJ SOLN
10.0000 mg | INTRAMUSCULAR | Status: DC | PRN
  Administered 2019-05-09: 10 mg via INTRAVENOUS
  Filled 2019-05-09: qty 1

## 2019-05-09 MED ORDER — LABETALOL HCL 5 MG/ML IV SOLN
40.0000 mg | INTRAVENOUS | Status: DC | PRN
  Administered 2019-05-09: 40 mg via INTRAVENOUS
  Filled 2019-05-09 (×2): qty 8

## 2019-05-09 MED ORDER — HYDRALAZINE HCL 20 MG/ML IJ SOLN: 10.0000 mg | INTRAMUSCULAR | Status: DC | PRN

## 2019-05-09 MED ORDER — NIFEDIPINE ER OSMOTIC RELEASE 30 MG PO TB24
60.0000 mg | ORAL_TABLET | Freq: Two times a day (BID) | ORAL | Status: DC
  Administered 2019-05-09 – 2019-05-12 (×7): 60 mg via ORAL
  Filled 2019-05-09 (×2): qty 1
  Filled 2019-05-09 (×3): qty 2
  Filled 2019-05-09 (×2): qty 1
  Filled 2019-05-09: qty 2
  Filled 2019-05-09: qty 1

## 2019-05-09 MED ORDER — MAGNESIUM SULFATE 40 GM/1000ML IV SOLN
INTRAVENOUS | Status: AC
  Administered 2019-05-09: 4 g via INTRAVENOUS
  Filled 2019-05-09: qty 1000

## 2019-05-09 NOTE — Anesthesia Procedure Notes (Signed)
Epidural Patient location during procedure: OB Start time: 05/09/2019 2:58 PM End time: 05/09/2019 3:13 PM  Staffing Anesthesiologist: Trevor Iha, MD Performed: anesthesiologist   Preanesthetic Checklist Completed: patient identified, IV checked, site marked, risks and benefits discussed, surgical consent, monitors and equipment checked, pre-op evaluation and timeout performed  Epidural Patient position: sitting Prep: DuraPrep and site prepped and draped Patient monitoring: continuous pulse ox and blood pressure Approach: midline Location: L3-L4 Injection technique: LOR air  Needle:  Needle type: Tuohy  Needle gauge: 17 G Needle length: 9 cm and 9 Needle insertion depth: 9 cm Catheter type: closed end flexible Catheter size: 19 Gauge Catheter at skin depth: 10 and 16 cm Test dose: negative  Assessment Events: blood not aspirated, injection not painful, no injection resistance, no paresthesia and negative IV test  Additional Notes Patient identified. Risks/Benefits/Options discussed with patient including but not limited to bleeding, infection, nerve damage, paralysis, failed block, incomplete pain control, headache, blood pressure changes, nausea, vomiting, reactions to medication both or allergic, itching and postpartum back pain. Confirmed with bedside nurse the patient's most recent platelet count. Confirmed with patient that they are not currently taking any anticoagulation, have any bleeding history or any family history of bleeding disorders. Patient expressed understanding and wished to proceed. All questions were answered. Sterile technique was used throughout the entire procedure. Please see nursing notes for vital signs. Test dose was given through epidural needle and negative prior to continuing to dose epidural or start infusion. Warning signs of high block given to the patient including shortness of breath, tingling/numbness in hands, complete motor block, or any  concerning symptoms with instructions to call for help. Patient was given instructions on fall risk and not to get out of bed. All questions and concerns addressed with instructions to call with any issues. 1 Attempt (S) . Patient tolerated procedure well.

## 2019-05-09 NOTE — Progress Notes (Signed)
Labor Progress Note Brenda Casey is a 34 y.o. G3P0020 at [redacted]w[redacted]d presented for ctx and found to have IUFD, complicated by breech delivery with fetal head entrapment.  S: Comfortable w epidural   O:  BP 138/76   Pulse (!) 112   Temp 98.2 F (36.8 C) (Oral)   Resp 18   Ht 5\' 4"  (1.626 m)   Wt 131 kg   LMP 08/31/2018   SpO2 100%   BMI 49.57 kg/m   Cervix: 1.5/80/0  A&P: 34 y.o. 34 [redacted]w[redacted]d here for IOL for IUFD c/b breech delivery with fetal head entrapment. #Labor: On exam cervix remains firmly around fetal head. S/p one dose of misoprostol [redacted]w[redacted]d, will proceed with another dose and re-examine in 2 hours. Reports she would like to hold baby once born. Cont to discuss Anora testing.   #IUFD: presumably secondary to HTN disease and previously noted REDF, however abruption also a possibility given severe PreE and images were not obtained of the placenta on admission ultrasound. Will send DIC labs and repeat CBC/CMP.  #cHTN with superimposed severe Pre-E: Patient denies symptoms. Severe-range pressures treated with Labetalol on admission. Patient continued on PTA Nifedipine, has not been receiving scheduled labetalol due to controlled BP's, reassess in AM to see if still necessary. Mag started; cont 24 hours PP. Serial BP's. Pr/Cr 0.39. CMP WNL.  #T2DM: last A1c 10.6 in Oct 2020, repeat 8.0 on admission. Patient has been taking 18 units Lantus and reports compliance with this. Reports she last took this morning. Will resume post-partum or when patient eating. Sliding scale currently.   #Hx of hypothyroidism not on medication: repeat TSH WNL; last TSH WNL Oct 2020  Nov 2020, MD 8:32 PM

## 2019-05-09 NOTE — Progress Notes (Signed)
Anesthesia at bedside.  Nitroglycerin  given IV to attempt to relax uterus/cervix.  BP's stable.

## 2019-05-09 NOTE — MAU Note (Signed)
Stat US called to bedside for further assessment.

## 2019-05-09 NOTE — Anesthesia Preprocedure Evaluation (Addendum)
Anesthesia Evaluation  Patient identified by MRN, date of birth, ID band Patient awake    Reviewed: Allergy & Precautions, NPO status , Patient's Chart, lab work & pertinent test results  Airway Mallampati: III  TM Distance: >3 FB Neck ROM: Full    Dental no notable dental hx. (+) Teeth Intact   Pulmonary neg pulmonary ROS,    Pulmonary exam normal breath sounds clear to auscultation       Cardiovascular hypertension, Normal cardiovascular exam Rhythm:Regular Rate:Normal     Neuro/Psych negative neurological ROS     GI/Hepatic   Endo/Other  diabetes, Poorly Controlled, GestationalHypothyroidism Morbid obesity  Renal/GU      Musculoskeletal   Abdominal (+) + obese,   Peds  Hematology Hgb 14.2 Plt 341   Anesthesia Other Findings   Reproductive/Obstetrics                            Anesthesia Physical Anesthesia Plan  ASA: III  Anesthesia Plan: Epidural   Post-op Pain Management:    Induction:   PONV Risk Score and Plan:   Airway Management Planned:   Additional Equipment:   Intra-op Plan:   Post-operative Plan:   Informed Consent: I have reviewed the patients History and Physical, chart, labs and discussed the procedure including the risks, benefits and alternatives for the proposed anesthesia with the patient or authorized representative who has indicated his/her understanding and acceptance.       Plan Discussed with:   Anesthesia Plan Comments: (33 6/7 wk G3P0 IUFD w C Htn w Superimposed preeclampsia, NIDDM, Hypothyroid, Obesity and Insuf PNC for LEA)        Anesthesia Quick Evaluation

## 2019-05-09 NOTE — MAU Note (Signed)
Difficulty obtaining FHR, [name], NP called to bedside for bedside US.

## 2019-05-09 NOTE — Progress Notes (Signed)
Labor Progress Note Brenda Casey is a 34 y.o. G3P0020 at [redacted]w[redacted]d presented for ctx and found to have IUFD. S: Patient has been contracting and ultimately refused Cytotec that was offered earlier. Received epidural about 45 minutes ago and when RN went to place Foley, saw presenting part in vagina.   O:  BP (!) 153/87   Pulse 95   Resp 18   Ht 5\' 4"  (1.626 m)   Wt 131 kg   LMP 08/31/2018   SpO2 97%   BMI 49.57 kg/m   Cervix: 1.5/80/0  A&P: 34 y.o. 34 [redacted]w[redacted]d here for IOL for IUFD. #Labor: Went bedside for presumed delivery. Patient able to bear down and infant delivered to waist from breech position. Cervix estimated as above with fetal head tightly within cervix. Patient agreeable to take oral Cytotec now. D/w Dr. [redacted]w[redacted]d. Nurses would also like to trial nitrous and this is okay as well. Anticipate spontaneous delivery of remaining fetal parts soon as Cytotec begins to work. Attempted to warn patient of fetal size and appearance. Reports she would like to hold baby once born. Discussed Anora testing and patient unsure at this time.  #cHTN with superimposed severe Pre-E: Patient denies symptoms. Severe-range pressures treated with Labetalol on admission. Patient continued on PTA Nifedipine. Mag started; cont 24 hours PP. Serial BP's. Pr/Cr 0.39. CMP WNL. #T2DM: last A1c 10.6 in Oct 2020, repeat 8.0 on admission. Patient has been taking 18 units Lantus and reports compliance with this. Reports she last took this morning. Will resume post-partum or when patient eating. Sliding scale currently. Last glucose 260.  #Hx of hypothyroidism not on medication: repeat TSH WNL; last TSH WNL Oct 2020  Nov 2020, MD 4:22 PM

## 2019-05-09 NOTE — MAU Note (Signed)
Pt reports LOF around 0830 and still is coming out on a pad.  Reports good fetal movement.  Having some low back pain. Denies bleeding

## 2019-05-09 NOTE — H&P (Signed)
OBSTETRIC ADMISSION HISTORY AND PHYSICAL  Brenda Casey is a 34 y.o. female G3P0020 with IUP at [redacted]w[redacted]d by 8 wk [place] presenting for ctx. Upon further evaluation, patient found to have IUFD although reported fetal movement. She reports No LOF, no VB, no blurry vision, headaches or peripheral edema, and RUQ pain. She declines anything for birth control. She [facility]   Dating: By 8 wk [place] --->  Estimated Date of Delivery: 06/21/19  Sono: 1/29   @[redacted]w[redacted]d , CWD, normal anatomy, cephalic presentation, fundal placental lie, 633g, <1% EFW  Prenatal History/Complications: Type 2 DM IUGR cHTN with superimposed Severe Preeclampsia Insufficient PNC Maternal Obesity REDF Hx of hypothyroidism not on medication  Past Medical History: Past Medical History:  Diagnosis Date  . Diabetes mellitus without complication ([place])   . Essential hypertension   . Hypothyroidism   . Morbid obesity ([place]) 07/30/2018   Last Assessment & Plan:  Uncontrolled patient and I discussed lifestyle and dietary measures to lower BMI, will continue to monitor.  . Obesity   . Retinopathy   . Thyroid disease     Past Surgical History: Past Surgical History:  Procedure Laterality Date  . NO PAST SURGERIES      Obstetrical History: OB History    Gravida  3   Para      Term      Preterm      AB  2   Living  0     SAB  2   TAB      Ectopic      Multiple      Live Births              Social History: Social History   Socioeconomic History  . Marital status: Single    Spouse name: Not on file  . Number of children: Not on file  . Years of education: Not on file  . Highest education level: Not on file  Occupational History  . Not on file  Tobacco Use  . Smoking status: Never Smoker  . Smokeless tobacco: Never Used  Substance and Sexual Activity  . Alcohol use: No  . Drug use: No  . Sexual activity: Yes    Birth control/protection: None  Other Topics Concern  . Not  on file  Social History Narrative  . Not on file   Social Determinants of Health   Financial Resource Strain:   . Difficulty of Paying Living Expenses:   Food Insecurity: No Food Insecurity  . Worried About Charity fundraiser in the Last Year: Never true  . Ran Out of Food in the Last Year: Never true  Transportation Needs: No Transportation Needs  . Lack of Transportation (Medical): No  . Lack of Transportation (Non-Medical): No  Physical Activity:   . Days of Exercise per Week:   . Minutes of Exercise per Session:   Stress:   . Feeling of Stress :   Social Connections:   . Frequency of Communication with Friends and Family:   . Frequency of Social Gatherings with Friends and Family:   . Attends Religious Services:   . Active Member of Clubs or Organizations:   . Attends Archivist Meetings:   Marland Kitchen Marital Status:     Family History: Family History  Problem Relation Age of Onset  . Diabetes Mother   . Hypertension Mother   . Breast cancer Mother   . Diabetes Father   . Hypertension Father   .  Breast cancer Maternal Grandmother     Allergies: Allergies  Allergen Reactions  . Monosodium Glutamate Hives  . Dairy Aid [employer]  . Other Hives    MSG  . Soy Allergy Hives  . Lac Bovis Diarrhea    Medications Prior to Admission  Medication Sig Dispense Refill Last Dose  . aspirin 81 MG chewable tablet Chew 1 tablet (81 mg total) by mouth daily. 90 tablet 3 05/08/2019 at Unknown time  . BD PEN NEEDLE NANO U/F 32G X 4 MM MISC USE WITH INSULIN PEN AS DIRECTED 100 each 2 05/09/2019 at Unknown time  . insulin glargine (LANTUS) 100 UNIT/ML injection Inject 0.18 mLs (18 Units total) into the skin daily. 10 mL 11 05/09/2019 at Unknown time  . labetalol (NORMODYNE) 200 MG tablet TAKE 2 TABLETS (400 MG TOTAL) BY MOUTH 3 (THREE) TIMES DAILY. 540 tablet 1 05/08/2019 at Unknown time  . NIFEdipine (ADALAT CC) 60 MG 24 hr tablet Take 1 tablet (60 mg total) by mouth 2 (two)  times daily. 60 tablet 3 05/08/2019 at Unknown time  . Prenatal Vit-Fe Fumarate-FA (PRENATAL MULTIVITAMIN) TABS tablet Take 1 tablet by mouth at bedtime.   05/08/2019 at Unknown time  . acetaminophen (TYLENOL) 325 MG tablet Take 2 tablets (650 mg total) by mouth every 4 (four) hours as needed (for pain scale < 4  OR  temperature  >/=  100.5 F). 30 tablet 0 More than a month at Unknown time     Review of Systems   All systems reviewed and negative except as stated in HPI  Blood pressure 136/61, pulse 86, resp. rate 18, height 5\' 4"  (1.626 m), weight 131 kg, last menstrual period 08/31/2018, SpO2 100 %, unknown if currently breastfeeding. General appearance: resting in bed and minimally conversive Lungs: normal effort Heart: regular rate  Abdomen: soft, non-tender; bowel sounds normal Pelvic: gravid uterus Extremities: Homans sign is negative, no sign of DVT Cervical exam deferred per patient request     Prenatal labs: ABO, Rh: --/--/O POS, O POS Performed at [facility], [address] [place], [place]  [phone]/29 2108) Antibody: NEG (01/29 2108) Rubella: 3.46 (10/21 1139) RPR: Non Reactive (10/21 1139)  HBsAg: Negative (10/21 1139)  HIV: Non Reactive (10/21 1139)  GBS:    A1c 10.6 on 12/08/2018 Genetic screening  NIPS with insufficient DNA x2, declined further testing Anatomy 12/10/2018: ? Cleft lip and palate, ? 2VC--limited due to body habitus  Prenatal Transfer Tool  Maternal Diabetes: Yes:  Diabetes Type:  Pre-pregnancy Genetic Screening: NIPS with insufficient DNA x2, declined further testing Maternal Ultrasounds/Referrals: ? Cleft lip and palate, ? 2VC--limited due to body habitus. IUGR. REDF Fetal Ultrasounds or other Referrals: Fetal Echo WNL Maternal Substance Abuse:  No Significant Maternal Medications:  None Significant Maternal Lab Results: Other: NA; IUFD  Results for orders placed or performed during the hospital encounter of 05/09/19 (from the past 24  hour(s))  Comprehensive metabolic panel   Collection Time: 05/09/19 10:35 AM  Result Value Ref Range   Sodium 138 135 - 145 mmol/L   Potassium 4.1 3.5 - 5.1 mmol/L   Chloride 108 98 - 111 mmol/L   CO2 21 (L) 22 - 32 mmol/L   Glucose, Bld 197 (H) 70 - 99 mg/dL   BUN 8 6 - 20 mg/dL   Creatinine, Ser 05/11/19 0.44 - 1.00 mg/dL   Calcium 9.2 8.9 - 6.04 mg/dL   Total Protein 7.3 6.5 - 8.1 g/dL  Albumin 3.2 (L) 3.5 - 5.0 g/dL   AST 19 15 - 41 U/L   ALT 16 0 - 44 U/L   Alkaline Phosphatase 67 38 - 126 U/L   Total Bilirubin 0.4 0.3 - 1.2 mg/dL   GFR calc non Af Amer >60 >60 mL/min   GFR calc Af Amer >60 >60 mL/min   Anion gap 9 5 - 15  CBC   Collection Time: May 24, 2019 10:35 AM  Result Value Ref Range   WBC 9.6 4.0 - 10.5 K/uL   RBC 5.32 (H) 3.87 - 5.11 MIL/uL   Hemoglobin 14.2 12.0 - 15.0 g/dL   HCT 57.8 46.9 - 62.9 %   MCV 82.3 80.0 - 100.0 fL   MCH 26.7 26.0 - 34.0 pg   MCHC 32.4 30.0 - 36.0 g/dL   RDW 52.8 41.3 - 24.4 %   Platelets 341 150 - 400 K/uL   nRBC 0.0 0.0 - 0.2 %    Patient Active Problem List   Diagnosis Date Noted  . IUFD at less than 20 weeks of gestation 05/24/2019  . Severe preeclampsia 05/24/19  . Intrauterine fetal death in pregnancy 05/24/19  . 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 IUGR (intrauterine growth restriction) affecting care of mother 03/18/2019  . Pregnancy affected by fetal growth restriction 03/09/2019  . Prior exam suggested fetal anomaly, antepartum - ? cleft lip and palate 01/25/2019  . Cervical high risk human papillomavirus (HPV) DNA test positive 12/13/2018  . Supervision of high risk pregnancy, antepartum 11/25/2018  . Hypertension affecting pregnancy 11/25/2018  . Type 2 diabetes mellitus affecting pregnancy, antepartum 11/25/2018  . 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 Essential hypertension 07/30/2018  . History of hypothyroidism 07/30/2018  . Morbid  obesity ([facility]) 07/30/2018  . Type 2 diabetes mellitus with retinopathy of both eyes ([facility]) 07/30/2018    Assessment/Plan:  Avana Kreiser is a 34 y.o. G3P0020 at [redacted]w[redacted]d here for IOL for IUFD.  #Labor: Patient presented for ctx and still reports painful ctx every 5 minutes. Declined cervical exam at this time. Will start with Cytotec 600 mg orally. Anticipate SVD. Awaiting formal read on [place] for fetal presentation.  #Pain: Per patient request #MOC: Declines #cHTN with superimposed severe Pre-E: Patient denies symptoms. Severe-range pressures treated with Labetalol on admission. Patient continued on PTA Nifedipine. Mag started; cont 24 hours PP. Serial BP's. Pr/Cr pending. CMP WNL. #T2DM: last A1c 10.6 in Oct 2020. Patient has been taking 18 units Lantus and reports compliance with this. Reports she last took this morning. Will resume post-partum or when patient eating. Sliding scale currently.  #Hx of hypothyroidism not on medication: repeat TSH ordered; last TSH WNL Oct 2020  Jerilynn Birkenhead, MD [facility] Fellow, [facility] for [facility], [facility] 05/24/2019, 1:02 PM

## 2019-05-09 NOTE — MAU Provider Note (Addendum)
History     CSN: [id number]  Arrival date and time: 05/09/19 0940   First Provider Initiated Contact with Patient 05/09/19 1020      Chief Complaint  Patient presents with  . Back Pain  . Rupture of Membranes   [name] is a 34 y.o. G3P0020 at [redacted]w[redacted]d who presents to MAU for LOF. Provider was called to bedside prior to patient being marked ready for MAU provider as the RN was unable to find heart tones with NST monitor. Provider to bedside for limited US and found no heart tones and no fluid was visualized. Called [place] technician to bedside who confirmed that fetal heart tones were not present. Patient did report normal fetal movement to nurse during triage.  Patient also with severe range blood pressures, but denies symptoms other than low back pain and LOF. Pt denies headache, blurry vision/seeing spots, chest pain or SOB, nausea or vomiting, abdominal pain or swelling in her face/hands.  OB History    Gravida  3   Para      Term      Preterm      AB  2   Living  0     SAB  2   TAB      Ectopic      Multiple      Live Births              Past Medical History:  Diagnosis Date  . Diabetes mellitus without complication (HCC)   . Essential hypertension   . Hypothyroidism   . Morbid obesity (HCC) 07/30/2018   Last Assessment & Plan:  Uncontrolled patient and I discussed lifestyle and dietary measures to lower BMI, will continue to monitor.  . Obesity   . Retinopathy   . Thyroid disease     Past Surgical History:  Procedure Laterality Date  . NO PAST SURGERIES      Family History  Problem Relation Age of Onset  . Diabetes Mother   . Hypertension Mother   . Breast cancer Mother   . Diabetes Father   . Hypertension Father   . Breast cancer Maternal Grandmother     Social History   Tobacco Use  . Smoking status: Never Smoker  . Smokeless tobacco: Never Used  Substance Use Topics  . Alcohol use: No  . Drug use: No    Allergies:   Allergies  Allergen Reactions  . Monosodium Glutamate Hives  . Dairy Aid [employer]  . Other Hives    MSG  . Soy Allergy Hives  . Lac Bovis Diarrhea    Medications Prior to Admission  Medication Sig Dispense Refill Last Dose  . aspirin 81 MG chewable tablet Chew 1 tablet (81 mg total) by mouth daily. 90 tablet 3 05/08/2019 at Unknown time  . BD PEN NEEDLE NANO U/F 32G X 4 MM MISC USE WITH INSULIN PEN AS DIRECTED 100 each 2 05/09/2019 at Unknown time  . insulin glargine (LANTUS) 100 UNIT/ML injection Inject 0.18 mLs (18 Units total) into the skin daily. 10 mL 11 05/09/2019 at Unknown time  . labetalol (NORMODYNE) 200 MG tablet TAKE 2 TABLETS (400 MG TOTAL) BY MOUTH 3 (THREE) TIMES DAILY. 540 tablet 1 05/08/2019 at Unknown time  . NIFEdipine (ADALAT CC) 60 MG 24 hr tablet Take 1 tablet (60 mg total) by mouth 2 (two) times daily. 60 tablet 3 05/08/2019 at Unknown time  . Prenatal Vit-Fe Fumarate-FA (PRENATAL MULTIVITAMIN) TABS tablet Take 1 tablet by  mouth at bedtime.   05/08/2019 at Unknown time  . acetaminophen (TYLENOL) 325 MG tablet Take 2 tablets (650 mg total) by mouth every 4 (four) hours as needed (for pain scale < 4  OR  temperature  >/=  100.5 F). 30 tablet 0 More than a month at Unknown time    Review of Systems  Constitutional: Negative for chills, diaphoresis, fatigue and fever.  Eyes: Negative for visual disturbance.  Respiratory: Negative for shortness of breath.   Cardiovascular: Negative for chest pain.  Gastrointestinal: Negative for abdominal pain, constipation, diarrhea, nausea and vomiting.  Genitourinary: Positive for vaginal discharge. Negative for dysuria, flank pain, frequency, pelvic pain, urgency and vaginal bleeding.  Musculoskeletal: Positive for back pain.  Neurological: Negative for dizziness, weakness, light-headedness and headaches.   Physical Exam   Blood pressure (!) 159/81, pulse 91, resp. rate 18, last menstrual period 08/31/2018, SpO2 100 %,  unknown if currently breastfeeding.  Patient Vitals for the past 24 hrs:  BP Pulse Resp SpO2  05/09/19 1207 (!) 159/81 91 -- --  05/09/19 1202 (!) 153/81 84 -- --  05/09/19 1200 (!) 160/89 -- -- --  05/09/19 1148 133/73 84 18 100 %  05/09/19 1118 (!) 162/84 80 -- --  05/09/19 1107 (!) 177/102 81 -- --  05/09/19 1043 (!) 168/88 97 -- --  05/09/19 0956 (!) 164/98 (!) 103 20 94 %   Physical Exam  Constitutional: She is oriented to person, place, and time. She appears well-developed and well-nourished. No distress.  HENT:  Head: Normocephalic and atraumatic.  Respiratory: Effort normal.  GI: Soft. She exhibits no distension and no mass. There is no abdominal tenderness. There is no rebound and no guarding.  Neurological: She is alert and oriented to person, place, and time.  Skin: Skin is warm and dry. She is not diaphoretic.  Psychiatric: She has a normal mood and affect. Her behavior is normal. Judgment and thought content normal.   Results for orders placed or performed during the hospital encounter of 05/09/19 (from the past 24 hour(s))  Comprehensive metabolic panel     Status: Abnormal   Collection Time: 05/09/19 10:35 AM  Result Value Ref Range   Sodium 138 135 - 145 mmol/L   Potassium 4.1 3.5 - 5.1 mmol/L   Chloride 108 98 - 111 mmol/L   CO2 21 (L) 22 - 32 mmol/L   Glucose, Bld 197 (H) 70 - 99 mg/dL   BUN 8 6 - 20 mg/dL   Creatinine, Ser 5.18 0.44 - 1.00 mg/dL   Calcium 9.2 8.9 - 84.1 mg/dL   Total Protein 7.3 6.5 - 8.1 g/dL   Albumin 3.2 (L) 3.5 - 5.0 g/dL   AST 19 15 - 41 U/L   ALT 16 0 - 44 U/L   Alkaline Phosphatase 67 38 - 126 U/L   Total Bilirubin 0.4 0.3 - 1.2 mg/dL   GFR calc non Af Amer >60 >60 mL/min   GFR calc Af Amer >60 >60 mL/min   Anion gap 9 5 - 15  CBC     Status: Abnormal   Collection Time: 05/09/19 10:35 AM  Result Value Ref Range   WBC 9.6 4.0 - 10.5 K/uL   RBC 5.32 (H) 3.87 - 5.11 MIL/uL   Hemoglobin 14.2 12.0 - 15.0 g/dL   HCT 66.0 63.0 -  16.0 %   MCV 82.3 80.0 - 100.0 fL   MCH 26.7 26.0 - 34.0 pg   MCHC 32.4 30.0 - 36.0 g/dL  RDW 13.5 11.5 - 15.5 %   Platelets 341 150 - 400 K/uL   nRBC 0.0 0.0 - 0.2 %   No results found.  MAU Course  Procedures  MDM -IUFD @[redacted]w[redacted]d  with risk factors including severe growth restriction <1%, cHTN, T2DM on insulin, obesity -called and spoke with Dr. Elly Modena, patient will be admitted to L&D for induction -Pt informed that the ultrasound is considered a limited OB ultrasound and is not intended to be a complete ultrasound exam.  Patient also informed that the ultrasound is not being completed with the intent of assessing for fetal or placental anomalies or any pelvic abnormalities.  Explained that the purpose of today's ultrasound is to assess for  viability (no FHR visualized).  Patient acknowledges the purpose of the exam and the limitations of the study.   -admit to L&D for induction  Orders Placed This Encounter  Procedures  . Respiratory Panel by RT PCR (Flu A&B, Covid) - Nasopharyngeal Swab    Standing Status:   Standing    Number of Occurrences:   1    Order Specific Question:   Is this test for diagnosis or screening    Answer:   Screening    Order Specific Question:   Symptomatic for COVID-19 as defined by CDC    Answer:   No    Order Specific Question:   Hospitalized for COVID-19    Answer:   No    Order Specific Question:   Admitted to ICU for COVID-19    Answer:   No    Order Specific Question:   Previously tested for COVID-19    Answer:   Yes    Order Specific Question:   Resident in a congregate (group) care setting    Answer:   No    Order Specific Question:   Employed in healthcare setting    Answer:   No    Order Specific Question:   Pregnant    Answer:   Yes  . [place] MFM OB LIMITED    Standing Status:   Standing    Number of Occurrences:   1    Order Specific Question:   Symptom/Reason for Exam    Answer:   Abnormal fetal heart rate complicating pregnancy [161096]   . Urinalysis, Routine w reflex microscopic    Standing Status:   Standing    Number of Occurrences:   1  . Comprehensive metabolic panel    Standing Status:   Standing    Number of Occurrences:   1  . CBC    Standing Status:   Standing    Number of Occurrences:   1  . Protein / creatinine ratio, urine    Standing Status:   Standing    Number of Occurrences:   1  . Notify Physician    Confirmatory reading of BP> 160/110 15 minutes later    Standing Status:   Standing    Number of Occurrences:   1    Order Specific Question:   Notify Physician    Answer:   Temp greater than or equal to 100.4    Order Specific Question:   Notify Physician    Answer:   RR greater than 24 or less than 10    Order Specific Question:   Notify Physician    Answer:   HR greater than 120 or less than 50    Order Specific Question:   Notify Physician    Answer:   SBP  greater than 160 mmHG or less than 80 mmHG    Order Specific Question:   Notify Physician    Answer:   DBP greater than 110 mmHG or less than 45 mmHG    Order Specific Question:   Notify Physician    Answer:   Urinary output is less than for any 4 hour period  . Measure blood pressure    20 minutes after giving hydralazine 10 MG IV dose.  Call MD if SBP >/= 160 or DBP >/= 110.    Standing Status:   Standing    Number of Occurrences:   1  . Insert and maintain IV Line    Standing Status:   Standing    Number of Occurrences:   1  . Insert peripheral IV    Standing Status:   Standing    Number of Occurrences:   1   Meds ordered this encounter  Medications  . DISCONTD: hydrALAZINE (APRESOLINE) injection 5 mg  . DISCONTD: hydrALAZINE (APRESOLINE) injection 10 mg  . DISCONTD: labetalol (NORMODYNE) injection 20 mg  . DISCONTD: labetalol (NORMODYNE) injection 40 mg  . AND Linked Order Group   . labetalol (NORMODYNE) injection 20 mg   . labetalol (NORMODYNE) injection 40 mg   . labetalol (NORMODYNE) injection 80 mg   . hydrALAZINE  (APRESOLINE) injection 10 mg  . lactated ringers infusion  . fentaNYL (SUBLIMAZE) injection 100 mcg  . magnesium bolus via infusion 4 g  . magnesium sulfate 40 grams in SWI 1000 mL OB infusion  . magnesium sulfate 40 grams in SWI 1000 mL 40 GM/1000ML infusion    Flippin, Holly   : cabinet override    Assessment and Plan   1. Fetal demise in singleton pregnancy greater than [redacted] weeks gestation, antepartum   2. Pregnancy affected by fetal growth restriction   3. Supervision of high risk pregnancy, antepartum   4. Type 2 diabetes mellitus affecting pregnancy, antepartum   5. Abnormal fetal heart rate complicating pregnancy   6. [redacted] weeks gestation of pregnancy   7. Severe preeclampsia, third trimester    -admit to L&D for induction  Odie Sera Marieke Lubke 05/09/2019, 12:23 PM

## 2019-05-10 ENCOUNTER — Inpatient Hospital Stay (HOSPITAL_COMMUNITY): Payer: Medicaid Other | Admitting: Anesthesiology

## 2019-05-10 ENCOUNTER — Encounter (HOSPITAL_COMMUNITY): Payer: Self-pay | Admitting: Obstetrics & Gynecology

## 2019-05-10 ENCOUNTER — Encounter (HOSPITAL_COMMUNITY)
Admission: AD | Disposition: A | Discharge status: Home / Self Care | Payer: Self-pay | Source: Home / Self Care | Attending: Family Medicine

## 2019-05-10 DIAGNOSIS — O364XX0 Maternal care for intrauterine death, not applicable or unspecified: Secondary | ICD-10-CM

## 2019-05-10 DIAGNOSIS — O411230 Chorioamnionitis, third trimester, not applicable or unspecified: Secondary | ICD-10-CM

## 2019-05-10 DIAGNOSIS — O1493 Unspecified pre-eclampsia, third trimester: Secondary | ICD-10-CM

## 2019-05-10 DIAGNOSIS — Z3A34 34 weeks gestation of pregnancy: Secondary | ICD-10-CM

## 2019-05-10 HISTORY — PX: DILATION AND EVACUATION: SHX1459

## 2019-05-10 LAB — PROTIME-INR
INR: 1.1 (ref 0.8–1.2)
Prothrombin Time: 14.2 seconds (ref 11.4–15.2)

## 2019-05-10 LAB — TYPE AND SCREEN
ABO/RH(D): O POS
Antibody Screen: NEGATIVE

## 2019-05-10 LAB — CBC
HCT: 28.1 % — ABNORMAL LOW (ref 36.0–46.0)
Hemoglobin: 9 g/dL — ABNORMAL LOW (ref 12.0–15.0)
MCH: 26.8 pg (ref 26.0–34.0)
MCHC: 32 g/dL (ref 30.0–36.0)
MCV: 83.6 fL (ref 80.0–100.0)
Platelets: 160 10*3/uL (ref 150–400)
RBC: 3.36 MIL/uL — ABNORMAL LOW (ref 3.87–5.11)
RDW: 14.1 % (ref 11.5–15.5)
WBC: 13.2 10*3/uL — ABNORMAL HIGH (ref 4.0–10.5)
nRBC: 0 % (ref 0.0–0.2)

## 2019-05-10 LAB — GC/CHLAMYDIA PROBE AMP (~~LOC~~) NOT AT ARMC
Chlamydia: NEGATIVE
Comment: NEGATIVE
Comment: NORMAL
Neisseria Gonorrhea: NEGATIVE

## 2019-05-10 LAB — DIC (DISSEMINATED INTRAVASCULAR COAGULATION)PANEL
D-Dimer, Quant: <0.27 ug/mL-FEU (ref 0.00–0.50)
Platelets: 159 10*3/uL (ref 150–400)
Smear Review: NONE SEEN

## 2019-05-10 LAB — GLUCOSE, CAPILLARY
Glucose-Capillary: 170 mg/dL — ABNORMAL HIGH (ref 70–99)
Glucose-Capillary: 213 mg/dL — ABNORMAL HIGH (ref 70–99)
Glucose-Capillary: 223 mg/dL — ABNORMAL HIGH (ref 70–99)
Glucose-Capillary: 168 mg/dL — ABNORMAL HIGH (ref 70–99)
Glucose-Capillary: 152 mg/dL — ABNORMAL HIGH (ref 70–99)
Glucose-Capillary: 145 mg/dL — ABNORMAL HIGH (ref 70–99)
Glucose-Capillary: 144 mg/dL — ABNORMAL HIGH (ref 70–99)

## 2019-05-10 LAB — FIBRINOGEN: Fibrinogen: 445 mg/dL (ref 210–475)

## 2019-05-10 LAB — APTT: aPTT: 24 seconds (ref 24–36)

## 2019-05-10 SURGERY — DILATION AND EVACUATION, UTERUS
Anesthesia: Monitor Anesthesia Care

## 2019-05-10 MED ORDER — SODIUM CHLORIDE 0.9 % IV SOLN: INTRAVENOUS | Status: DC

## 2019-05-10 MED ORDER — GENTAMICIN SULFATE 40 MG/ML IJ SOLN
5.0000 mg/kg | INTRAVENOUS | Status: AC
  Administered 2019-05-11: 430 mg via INTRAVENOUS
  Filled 2019-05-10: qty 10.75

## 2019-05-10 MED ORDER — MEPERIDINE HCL 25 MG/ML IJ SOLN: 6.2500 mg | INTRAMUSCULAR | Status: DC | PRN

## 2019-05-10 MED ORDER — OXYCODONE-ACETAMINOPHEN 5-325 MG PO TABS: 1.0000 | ORAL_TABLET | ORAL | Status: DC | PRN

## 2019-05-10 MED ORDER — MISOPROSTOL 200 MCG PO TABS
ORAL_TABLET | ORAL | Status: AC
  Filled 2019-05-10: qty 1

## 2019-05-10 MED ORDER — ACETAMINOPHEN 325 MG PO TABS: 325.0000 mg | ORAL_TABLET | ORAL | Status: DC | PRN

## 2019-05-10 MED ORDER — CLINDAMYCIN PHOSPHATE 900 MG/50ML IV SOLN
900.0000 mg | Freq: Once | INTRAVENOUS | Status: AC
  Administered 2019-05-10: 900 mg via INTRAVENOUS
  Filled 2019-05-10: qty 50

## 2019-05-10 MED ORDER — IBUPROFEN 600 MG PO TABS
600.0000 mg | ORAL_TABLET | Freq: Four times a day (QID) | ORAL | Status: DC
  Administered 2019-05-11 – 2019-05-12 (×4): 600 mg via ORAL
  Filled 2019-05-10 (×7): qty 1

## 2019-05-10 MED ORDER — WITCH HAZEL-GLYCERIN EX PADS: 1.0000 "application " | MEDICATED_PAD | CUTANEOUS | Status: DC | PRN

## 2019-05-10 MED ORDER — OXYTOCIN 10 UNIT/ML IJ SOLN
INTRAMUSCULAR | Status: DC | PRN
  Administered 2019-05-10: 40 [IU] via INTRAMUSCULAR

## 2019-05-10 MED ORDER — OXYTOCIN 40 UNITS IN NORMAL SALINE INFUSION - SIMPLE MED
1.0000 m[IU]/min | INTRAVENOUS | Status: DC
  Administered 2019-05-10: 24 m[IU]/min via INTRAVENOUS
  Administered 2019-05-10: 4 m[IU]/min via INTRAVENOUS

## 2019-05-10 MED ORDER — COCONUT OIL OIL: 1.0000 "application " | TOPICAL_OIL | Status: DC | PRN

## 2019-05-10 MED ORDER — OXYCODONE-ACETAMINOPHEN 5-325 MG PO TABS: 2.0000 | ORAL_TABLET | ORAL | Status: DC | PRN

## 2019-05-10 MED ORDER — MISOPROSTOL 200 MCG PO TABS
200.0000 ug | ORAL_TABLET | Freq: Once | ORAL | Status: AC
  Administered 2019-05-10: 200 ug via RECTAL

## 2019-05-10 MED ORDER — DIPHENHYDRAMINE HCL 25 MG PO CAPS: 25.0000 mg | ORAL_CAPSULE | Freq: Four times a day (QID) | ORAL | Status: DC | PRN

## 2019-05-10 MED ORDER — MISOPROSTOL 200 MCG PO TABS
800.0000 ug | ORAL_TABLET | ORAL | Status: DC
  Administered 2019-05-10: 800 ug via RECTAL

## 2019-05-10 MED ORDER — SODIUM CHLORIDE 0.9 % IR SOLN
Status: DC | PRN
  Administered 2019-05-10: 1

## 2019-05-10 MED ORDER — SIMETHICONE 80 MG PO CHEW: 80.0000 mg | CHEWABLE_TABLET | ORAL | Status: DC | PRN

## 2019-05-10 MED ORDER — MISOPROSTOL 200 MCG PO TABS
ORAL_TABLET | ORAL | Status: AC
  Filled 2019-05-10: qty 4

## 2019-05-10 MED ORDER — INSULIN ASPART 100 UNIT/ML ~~LOC~~ SOLN
0.0000 [IU] | SUBCUTANEOUS | Status: DC
  Administered 2019-05-10: 3 [IU] via SUBCUTANEOUS

## 2019-05-10 MED ORDER — KETAMINE HCL 50 MG/5ML IJ SOSY
PREFILLED_SYRINGE | INTRAMUSCULAR | Status: AC
  Filled 2019-05-10: qty 5

## 2019-05-10 MED ORDER — SENNOSIDES-DOCUSATE SODIUM 8.6-50 MG PO TABS
2.0000 | ORAL_TABLET | ORAL | Status: DC
  Administered 2019-05-11 – 2019-05-12 (×2): 2 via ORAL
  Filled 2019-05-10 (×2): qty 2

## 2019-05-10 MED ORDER — MIDAZOLAM HCL 5 MG/5ML IJ SOLN
INTRAMUSCULAR | Status: DC | PRN
  Administered 2019-05-10 (×2): 1 mg via INTRAVENOUS

## 2019-05-10 MED ORDER — SODIUM CHLORIDE 0.9 % IV SOLN: INTRAVENOUS | Status: DC | PRN

## 2019-05-10 MED ORDER — DIBUCAINE (PERIANAL) 1 % EX OINT: 1.0000 "application " | TOPICAL_OINTMENT | CUTANEOUS | Status: DC | PRN

## 2019-05-10 MED ORDER — TERBUTALINE SULFATE 1 MG/ML IJ SOLN: 0.2500 mg | Freq: Once | INTRAMUSCULAR | Status: DC | PRN

## 2019-05-10 MED ORDER — OXYCODONE HCL 5 MG PO TABS: 5.0000 mg | ORAL_TABLET | Freq: Once | ORAL | Status: DC | PRN

## 2019-05-10 MED ORDER — SODIUM CHLORIDE 0.9 % IV SOLN
INTRAVENOUS | Status: DC | PRN
  Administered 2019-05-10: 500 mg via INTRAVENOUS

## 2019-05-10 MED ORDER — DEXTROSE 50 % IV SOLN: 0.0000 mL | INTRAVENOUS | Status: DC | PRN

## 2019-05-10 MED ORDER — PROPOFOL 10 MG/ML IV BOLUS
INTRAVENOUS | Status: DC | PRN
  Administered 2019-05-10 (×2): 20 mg via INTRAVENOUS

## 2019-05-10 MED ORDER — INSULIN ASPART 100 UNIT/ML ~~LOC~~ SOLN
0.0000 [IU] | Freq: Three times a day (TID) | SUBCUTANEOUS | Status: DC
  Administered 2019-05-11: 1 [IU] via SUBCUTANEOUS
  Administered 2019-05-12: 3 [IU] via SUBCUTANEOUS

## 2019-05-10 MED ORDER — LIDOCAINE HCL (CARDIAC) PF 100 MG/5ML IV SOSY
PREFILLED_SYRINGE | INTRAVENOUS | Status: DC | PRN
  Administered 2019-05-10: 20 mg via INTRAVENOUS

## 2019-05-10 MED ORDER — SODIUM CHLORIDE 0.9% FLUSH: 10.0000 mL | INTRAVENOUS | Status: DC | PRN

## 2019-05-10 MED ORDER — ZOLPIDEM TARTRATE 5 MG PO TABS
5.0000 mg | ORAL_TABLET | Freq: Every evening | ORAL | Status: DC | PRN
  Administered 2019-05-11: 5 mg via ORAL
  Filled 2019-05-10: qty 1

## 2019-05-10 MED ORDER — DEXTROSE-NACL 5-0.45 % IV SOLN: INTRAVENOUS | Status: DC

## 2019-05-10 MED ORDER — SODIUM CHLORIDE 0.9% FLUSH
10.0000 mL | Freq: Two times a day (BID) | INTRAVENOUS | Status: DC
  Administered 2019-05-10: 10 mL

## 2019-05-10 MED ORDER — OXYTOCIN 40 UNITS IN NORMAL SALINE INFUSION - SIMPLE MED
INTRAVENOUS | Status: AC
  Filled 2019-05-10: qty 1000

## 2019-05-10 MED ORDER — INSULIN GLARGINE 100 UNIT/ML ~~LOC~~ SOLN
18.0000 [IU] | Freq: Every day | SUBCUTANEOUS | Status: DC
  Administered 2019-05-10 – 2019-05-11 (×2): 18 [IU] via SUBCUTANEOUS
  Filled 2019-05-10 (×3): qty 0.18

## 2019-05-10 MED ORDER — TETANUS-DIPHTH-ACELL PERTUSSIS 5-2.5-18.5 LF-MCG/0.5 IM SUSP: 0.5000 mL | Freq: Once | INTRAMUSCULAR | Status: DC

## 2019-05-10 MED ORDER — FENTANYL CITRATE (PF) 100 MCG/2ML IJ SOLN
INTRAMUSCULAR | Status: AC
  Filled 2019-05-10: qty 2

## 2019-05-10 MED ORDER — INSULIN GLARGINE 100 UNIT/ML ~~LOC~~ SOLN
12.0000 [IU] | Freq: Every day | SUBCUTANEOUS | Status: DC
  Administered 2019-05-10: 12 [IU] via SUBCUTANEOUS
  Filled 2019-05-10: qty 0.12

## 2019-05-10 MED ORDER — ACETAMINOPHEN 325 MG PO TABS
650.0000 mg | ORAL_TABLET | ORAL | Status: DC | PRN
  Administered 2019-05-11: 650 mg via ORAL
  Filled 2019-05-10: qty 2

## 2019-05-10 MED ORDER — ONDANSETRON HCL 4 MG/2ML IJ SOLN: 4.0000 mg | Freq: Once | INTRAMUSCULAR | Status: DC | PRN

## 2019-05-10 MED ORDER — LIDOCAINE-EPINEPHRINE (PF) 2 %-1:200000 IJ SOLN
INTRAMUSCULAR | Status: DC | PRN
  Administered 2019-05-10: 8 mL via EPIDURAL

## 2019-05-10 MED ORDER — ONDANSETRON HCL 4 MG/2ML IJ SOLN
INTRAMUSCULAR | Status: AC
  Filled 2019-05-10: qty 2

## 2019-05-10 MED ORDER — TRANEXAMIC ACID-NACL 1000-0.7 MG/100ML-% IV SOLN
INTRAVENOUS | Status: DC | PRN
  Administered 2019-05-10: 1000 mg via INTRAVENOUS

## 2019-05-10 MED ORDER — OXYCODONE HCL 5 MG/5ML PO SOLN: 5.0000 mg | Freq: Once | ORAL | Status: DC | PRN

## 2019-05-10 MED ORDER — TRANEXAMIC ACID-NACL 1000-0.7 MG/100ML-% IV SOLN
INTRAVENOUS | Status: AC
  Filled 2019-05-10: qty 100

## 2019-05-10 MED ORDER — ACETAMINOPHEN 160 MG/5ML PO SOLN: 325.0000 mg | ORAL | Status: DC | PRN

## 2019-05-10 MED ORDER — LACTATED RINGERS IV SOLN: INTRAVENOUS | Status: DC

## 2019-05-10 MED ORDER — MAGNESIUM SULFATE 40 GM/1000ML IV SOLN
2.0000 g/h | INTRAVENOUS | Status: AC
  Administered 2019-05-11: 2 g/h via INTRAVENOUS
  Filled 2019-05-10: qty 1000

## 2019-05-10 MED ORDER — ONDANSETRON HCL 4 MG/2ML IJ SOLN: 4.0000 mg | INTRAMUSCULAR | Status: DC | PRN

## 2019-05-10 MED ORDER — BENZOCAINE-MENTHOL 20-0.5 % EX AERO: 1.0000 "application " | INHALATION_SPRAY | CUTANEOUS | Status: DC | PRN

## 2019-05-10 MED ORDER — PRENATAL MULTIVITAMIN CH
1.0000 | ORAL_TABLET | Freq: Every day | ORAL | Status: DC
  Administered 2019-05-11 – 2019-05-12 (×2): 1 via ORAL
  Filled 2019-05-10 (×2): qty 1

## 2019-05-10 MED ORDER — ENOXAPARIN SODIUM 80 MG/0.8ML ~~LOC~~ SOLN
70.0000 mg | SUBCUTANEOUS | Status: DC
  Administered 2019-05-11: 70 mg via SUBCUTANEOUS
  Filled 2019-05-10: qty 0.8

## 2019-05-10 MED ORDER — SODIUM CHLORIDE 0.9 % IV SOLN
2.0000 g | Freq: Four times a day (QID) | INTRAVENOUS | Status: AC
  Administered 2019-05-10 – 2019-05-11 (×2): 2 g via INTRAVENOUS
  Filled 2019-05-10 (×2): qty 2000

## 2019-05-10 MED ORDER — KETAMINE HCL 10 MG/ML IJ SOLN
INTRAMUSCULAR | Status: DC | PRN
  Administered 2019-05-10: 30 mg via INTRAVENOUS

## 2019-05-10 MED ORDER — ONDANSETRON HCL 4 MG PO TABS: 4.0000 mg | ORAL_TABLET | ORAL | Status: DC | PRN

## 2019-05-10 MED ORDER — INSULIN REGULAR(HUMAN) IN NACL 100-0.9 UT/100ML-% IV SOLN
INTRAVENOUS | Status: DC
  Filled 2019-05-10: qty 100

## 2019-05-10 MED ORDER — MIDAZOLAM HCL 2 MG/2ML IJ SOLN
INTRAMUSCULAR | Status: AC
  Filled 2019-05-10: qty 2

## 2019-05-10 MED ORDER — FENTANYL CITRATE (PF) 100 MCG/2ML IJ SOLN: 25.0000 ug | INTRAMUSCULAR | Status: DC | PRN

## 2019-05-10 SURGICAL SUPPLY — 20 items
CATH ROBINSON RED A/P 16FR (CATHETERS) ×3 IMPLANT
CLOTH BEACON ORANGE TIMEOUT ST (SAFETY) ×3 IMPLANT
DECANTER SPIKE VIAL GLASS SM (MISCELLANEOUS) ×3 IMPLANT
GLOVE BIOGEL PI IND STRL 7.0 (GLOVE) ×1 IMPLANT
GLOVE BIOGEL PI IND STRL 7.5 (GLOVE) ×1 IMPLANT
GLOVE BIOGEL PI INDICATOR 7.0 (GLOVE) ×2
GLOVE BIOGEL PI INDICATOR 7.5 (GLOVE) ×2
GLOVE ECLIPSE 7.5 STRL STRAW (GLOVE) ×3 IMPLANT
GOWN STRL REUS W/TWL LRG LVL3 (GOWN DISPOSABLE) ×6 IMPLANT
KIT BERKELEY 1ST TRIMESTER 3/8 (MISCELLANEOUS) ×3 IMPLANT
NS IRRIG 1000ML POUR BTL (IV SOLUTION) ×3 IMPLANT
PACK VAGINAL MINOR WOMEN LF (CUSTOM PROCEDURE TRAY) ×3 IMPLANT
PAD OB MATERNITY 4.3X12.25 (PERSONAL CARE ITEMS) ×3 IMPLANT
PAD PREP 24X48 CUFFED NSTRL (MISCELLANEOUS) ×3 IMPLANT
SET BERKELEY SUCTION TUBING (SUCTIONS) ×3 IMPLANT
TOWEL OR 17X24 6PK STRL BLUE (TOWEL DISPOSABLE) ×6 IMPLANT
VACURETTE 10 RIGID CVD (CANNULA) IMPLANT
VACURETTE 7MM CVD STRL WRAP (CANNULA) IMPLANT
VACURETTE 8 RIGID CVD (CANNULA) IMPLANT
VACURETTE 9 RIGID CVD (CANNULA) IMPLANT

## 2019-05-10 NOTE — Progress Notes (Signed)
Patient ID: Brenda Casey, female   DOB: 1985/07/04, 34 y.o.   MRN: [id number]  No progression - has reached pitocin for over an hour with no improvement of cervical dilation. I discussed this with the patient, discussed options of continuing with conservative measures for several more hours vs more aggressive management with Duhrssen's incision in OR with possible D&E. Risks, benefits discussed, including bleeding, possible need for D&E, possible problems with cervix in future pregnancy (cervical incompetence), DVE risks, injury to fetus.  Patient opts for surgical management. Will cover with Natasha Bence and clindamycin (already received gentamycin, which should cover for 24 hours).   CBC and DIC panel drawn now (last drawn yesterday), although I don't currently suspect that the patient is in DIC.   Levie Heritage, DO 05/10/2019 3:38 PM

## 2019-05-10 NOTE — Progress Notes (Signed)
Labor Progress Note Brenda Casey is a 34 y.o. G3P0020 at [redacted]w[redacted]d presented for ctx and found to have IUFD, complicated by breech delivery with fetal head entrapment.  S: Comfortable w epidural   O:  BP 137/77   Pulse (!) 107   Temp (!) 101.4 F (38.6 C) (Oral)   Resp 18   Ht 5\' 4"  (1.626 m)   Wt 131 kg   LMP 08/31/2018   SpO2 100%   BMI 49.57 kg/m   Cervix: 2.5/80/0  A&P: 34 y.o. 34 [redacted]w[redacted]d here for IOL for IUFD c/b breech delivery with fetal head entrapment.  #Labor: s/p 2 doses of misoprostol [redacted]w[redacted]d with minimal change in cervix. Given third dose and then additional PR, will plan to give PR at next check. Reports she would like to hold baby once born. Cont to discuss Anora testing.   #Triple I: developed fever to 102.28F that has persisted over several hours, initially attributed to misoprostol however given persistence and new increase in WBC 9.6>16.3 in setting of IUFD started on Amp/Gent for presumed chorioamnionitis.   #IUFD: DIC labs checked reassuring w Fibrinogen 445 and normal coags  #cHTN with superimposed severe Pre-E: Patient denies symptoms. Severe-range pressures treated with Labetalol on admission. Patient continued on PTA Nifedipine, has not been receiving scheduled labetalol due to controlled BP's, reassess in AM to see if still necessary. Mag started; cont 24 hours PP. Serial BP's. Pr/Cr 0.39. CMP WNL.  #T2DM: last A1c 10.6 in Oct 2020, repeat 8.0 on admission. Patient has been taking 18 units Lantus and reports compliance with this. Reports she last took this morning. Will resume post-partum or when patient eating. Sliding scale currently.   #Hx of hypothyroidism not on medication: repeat TSH WNL; last TSH WNL Oct 2020  Nov 2020, MD 2:27 AM

## 2019-05-10 NOTE — Progress Notes (Signed)
Chaplain visited with [place] Scrape and her sister a couple of times throughout the day to introduce spiritual care services and offer support as she continues to labor with her daughter Elenore Rota (Pt is still deciding on the spelling of the baby's name.)  Pt reports yesterday was particularly difficult as she came to grips with learning that her baby no longer had a heart beat.  She is hopeful that her labor will be able to be completed vaginally.  We spoke a bit about some of her hopes for memory making-she would like to hold the baby and also hopes to have hand prints and foot prints as well.  II offered space for her to talk about what her hopes and dreams had been for labor before she found out her daughter had died.  The family shared that Annalisia has purchased an outfit for her daughter and is debating having someone bring it to the hospital. I affirmed that despite not having the labor and delivery she planned for, there are still opportunities to create memories of her time with her daughter and being able to experience things in some of the same ways she'd hoped for can be helpful.  Chaplain followed up later in the day.  Pt was quietly resting in her bed.  No needs at this time.  Please page as further needs arise.  Maryanna Shape. Carley Hammed, M.Div. [facility] Chaplain Pager [phone] Office [phone]

## 2019-05-10 NOTE — Progress Notes (Signed)
Pharmacy Antibiotic Note  Brenda Casey is a 34 y.o. female admitted on 05/09/2019 with contractions and found to have IUFD.  She is now being treated for chorioamnionitis with ampicillin and gentamicin.  Pharmacy has been consulted for gentamicin dosing.  Plan: Gentamicin 5mg /kg adjusted BW Q24 hours  Height: 5\' 4"  (162.6 cm) Weight: 288 lb 12.8 oz (131 kg) IBW/kg (Calculated) : 54.7  Temp (24hrs), Avg:99.8 F (37.7 C), Min:97.7 F (36.5 C), Max:102.5 F (39.2 C)  Recent Labs  Lab 05/09/19 1035 05/09/19 2040  WBC 9.6 16.3*  CREATININE 0.85 0.97    Estimated Creatinine Clearance: 112 mL/min (by C-G formula based on SCr of 0.97 mg/dL).    Allergies  Allergen Reactions  . Monosodium Glutamate Hives  . Dairy Aid 05/11/19  . Other Hives    MSG  . Soy Allergy Hives  . Lac Bovis Diarrhea    Antimicrobials this admission: Ampicillin 2g Q6  3/22 >>  Gentamicin 5mg /kg Q24  3/23 >>    Thank you for allowing pharmacy to be a part of this patient's care.  4/22 05/10/2019 1:16 AM

## 2019-05-10 NOTE — Progress Notes (Signed)
Patient ID: Brenda Casey, female   DOB: May 30, 1985, 34 y.o.   MRN: [id number]   Seen, examined at 9am. Comfortable with epidural. Feeling some pressure. Cervix softer and thinner - estimate 3-4cm dilated, although difficult to fully estimate due to partially delivered fetus. Baby positioned breach with corpus delivered - head still entrapped. Pitocin started at 9:15am. (increase every 30 min). I anticipate that this will allow the cervix to dilate. If head still entrapped after several hours of pitocin, then may proceed with duhrssen incision on the cervix in the OR.  Levie Heritage, DO

## 2019-05-10 NOTE — Discharge Summary (Signed)
Postpartum Discharge Summary     Patient Name: Brenda Casey DOB: 26-Nov-1985 MRN: [id number]  Date of admission: 05/09/2019 Delivering Provider: Truett Mainland   Date of discharge: 05/12/2019  Admitting diagnosis: Intrauterine fetal death in pregnancy [O36.4XX0] Intrauterine pregnancy: [redacted]w[redacted]d    Secondary diagnosis:  Active Problems:   Hypertension affecting pregnancy   History of hypothyroidism   Morbid [place]   Type 2 diabetes mellitus affecting pregnancy, antepartum   IUGR (intrauterine growth restriction) affecting care of mother   IUFD at less than 20 weeks of gestation   Severe preeclampsia   Intrauterine fetal death in pregnancy  Additional problems:     Discharge diagnosis: chronic HTN with superimposed preeclampsia with severe features, Type 2 DM, FGR, IUFD, footling breech with head entrapment, s/p Durhssen's incision and repair, s/p vaginal delivery                                                                                                Post partum procedures:none  Augmentation: Pitocin and Cytotec  Complications: fetal head entrapment, requiring Durhssen's incision,   Hospital course:  Onset of Labor With Vaginal Delivery     34y.o. yo G3P0020 at [id number] admitted with contractions, found to have intrauterine fetal demise. Magnesium was started. She initially refused cytotec and delivered the baby partially double footling breech. She received nitrous oxide, several doses of cytotec, and pitocin in an attempt to dilate her cervix to deliver the baby. This was ineffective. She was taken to the OR, where a Durhssen's Incision was made and the nonviable baby and placenta were delivered. Please see operative note for details of the procedure. Magnesium was continued for 24 hours and she was continued on antibiotics for 1 dose after the delivery of the baby for treatment of Chorioamnionitis. Pateint had an uncomplicated postpartum course.  She is  ambulating, tolerating a regular diet, passing flatus, and urinating well. Patient is discharged home in stable condition on 05/12/19.  Delivery time: 4:48 PM    Magnesium Sulfate received: Yes BMZ received: No Rhophylac:N/A MMR:No Transfusion:No  Physical exam  Vitals:   05/11/19 1600 05/11/19 2012 05/12/19 0420 05/12/19 0909  BP:  (!) 143/79 (!) 141/79   Pulse:  (!) 112 97   Resp: _0 Temp:  98.2 F (36.8 C) 98.6 F (37 C) 98.5 F (36.9 C)  TempSrc:   Oral Oral  SpO2:  98% 98% 98%  Weight:      Height:       General: alert, cooperative and no distress Lochia: appropriate Uterine Fundus: firm DVT Evaluation: No evidence of DVT seen on physical exam. Labs: Lab Results  Component Value Date   WBC 13.6 (H) 05/11/2019   HGB 12.0 05/11/2019   HCT 36.4 05/11/2019   MCV 82.0 05/11/2019   PLT 265 05/11/2019   CMP Latest Ref Rng & Units 05/09/2019  Glucose 70 - 99 mg/dL 142(H)  BUN 6 - 20 mg/dL 9  Creatinine 0.44 - 1.00 mg/dL 0.97  Sodium 135 - 145 mmol/L 135  Potassium 3.5 - 5.1 mmol/L  4.0  Chloride 98 - 111 mmol/L 100  CO2 22 - 32 mmol/L 23  Calcium 8.9 - 10.3 mg/dL 8.5(L)  Total Protein 6.5 - 8.1 g/dL 7.3  Total Bilirubin 0.3 - 1.2 mg/dL 0.6  Alkaline Phos 38 - 126 U/L 77  AST 15 - 41 U/L 19  ALT 0 - 44 U/L 18   Edinburgh Score: No flowsheet data found.  Discharge instruction: per After Visit Summary and "Baby and Me Booklet".  After visit meds:  Allergies as of 05/12/2019      Reactions   Monosodium Glutamate Hives   Dairy Aid [lactase] Hives   Other Hives   MSG   Soy Allergy Hives   Lac Bovis Diarrhea      Medication List    STOP taking these medications   aspirin 81 MG chewable tablet   labetalol 200 MG tablet Commonly known as: NORMODYNE   NIFEdipine 60 MG 24 hr tablet Commonly known as: ADALAT CC     TAKE these medications   acetaminophen 325 MG tablet Commonly known as: Tylenol Take 2 tablets (650 mg total) by mouth every 4  (four) hours as needed (for pain scale < 4).   BD Pen Needle Nano U/F 32G X 4 MM Misc Generic drug: Insulin Pen Needle USE WITH INSULIN PEN AS DIRECTED   enalapril 10 MG tablet Commonly known as: VASOTEC Take 1 tablet (10 mg total) by mouth daily. Start taking on: May 13, 2019   ibuprofen 600 MG tablet Commonly known as: ADVIL Take 1 tablet (600 mg total) by mouth every 6 (six) hours.   insulin glargine 100 UNIT/ML injection Commonly known as: LANTUS Inject 0.18 mLs (18 Units total) into the skin daily.   prenatal multivitamin Tabs tablet Take 1 tablet by mouth at bedtime.       Diet: carb modified diet  Activity: Advance as tolerated. Pelvic rest for 6 weeks.   Outpatient follow up: next week for BP and mood check Follow up Appt: Future Appointments  Date Time Provider [place]  05/19/2019  2:35 PM Chancy Milroy, MD [place]  05/23/2019  8:45 AM [place]  05/26/2019  1:30 PM [place] [facility]   Follow up Visit:    Please schedule this patient for Postpartum visit in: 1 week with the following provider: MD In-Person For C/S patients schedule nurse incision check in weeks 2 weeks: n/a  High risk pregnancy complicated by: Type 2 DM, CHTN with superimposed preeclampsia, FGR, IUFD Delivery mode:  SVD Anticipated Birth Control:  other/unsure PP Procedures needed: BP check, mood check visit Schedule Integrated [place] visit: yes     Newborn Data: Live born child  Birth Weight:   APGAR: 0, 0  Newborn Delivery   Birth date/time: 05/10/2019 16:48:00 Delivery type: Vaginal, Breech      Baby Feeding: n/a Disposition:morgue   05/12/2019 Mora Bellman, MD

## 2019-05-10 NOTE — Progress Notes (Signed)
Labor Progress Note Brenda Casey is a 34 y.o. G3P0020 at [redacted]w[redacted]d presented for ctx and found to have IUFD, complicated by breech delivery with fetal head entrapment.  S: Comfortable w epidural   O:  BP 133/79   Pulse (!) 124   Temp (!) 100.8 F (38.2 C) (Oral)   Resp 18   Ht 5\' 4"  (1.626 m)   Wt 131 kg   LMP 08/31/2018   SpO2 100%   BMI 49.57 kg/m   Cervix: 2.5/80/0  A&P: 34 y.o. 35 [redacted]w[redacted]d here for IOL for IUFD c/b breech delivery with fetal head entrapment.  #Labor: s/p 3 doses of misoprostol [redacted]w[redacted]d without significant dilation, on this exam cervix does feel somewhat softer. Given fourth dose of PR. Reports she would like to hold baby once born. Cont to discuss Anora testing.   #Triple I: developed fever to 102.9F that has persisted over several hours, initially attributed to misoprostol however given persistence and new increase in WBC 9.6>16.3 in setting of IUFD started on Amp/Gent for presumed chorioamnionitis.   #IUFD: DIC labs checked reassuring w Fibrinogen 445 and normal coags  #cHTN with superimposed severe Pre-E: Patient denies symptoms. Severe-range pressures treated with Labetalol on admission. Patient continued on PTA Nifedipine, has not been receiving PTA labetalol due to controlled BP's, reassess in AM to see if still necessary. Mag started; cont 24 hours PP. Serial BP's. Pr/Cr 0.39. CMP WNL.  #T2DM: last A1c 10.6 in Oct 2020, repeat 8.0 on admission. Patient has been taking 18 units Lantus and reports compliance with this. Reports she last took this morning. Will resume post-partum or when patient eating. Sliding scale currently.   #Hx of hypothyroidism not on medication: repeat TSH WNL; last TSH WNL Oct 2020  Nov 2020, MD 5:22 AM

## 2019-05-10 NOTE — Progress Notes (Signed)
Patient ID: Brenda Casey, female   DOB: 1985-11-21, 34 y.o.   MRN: [id number]  Patient comfortable. Pitocin at 24 milliunits.   BP 137/73   Pulse (!) 102   Temp 98.8 F (37.1 C) (Oral)   Resp 16   Ht 5\' 4"  (1.626 m)   Wt 131 kg   LMP 08/31/2018   SpO2 100%   BMI 49.57 kg/m   Cervix starting to dilate. Continue with pitocin.  Patient on Magnesium - BP controlled Triple I - on amp and gent  Anticipate vaginal delivery. Seems like pitocin starting to dilate cervix more. Will give a little more time for pitocin to dilate cervix.  09/02/2018, DO

## 2019-05-10 NOTE — Transfer of Care (Signed)
Immediate Anesthesia Transfer of Care Note  Patient: Brenda Casey  Procedure(s) Performed: DILATATION AND EVACUATION (N/A )  Patient Location: PACU  Anesthesia Type:Epidural  Level of Consciousness: awake, oriented, sedated and patient cooperative  Airway & Oxygen Therapy: Patient Spontanous Breathing  Post-op Assessment: Report given to RN and Post -op Vital signs reviewed and stable  Post vital signs: Reviewed and stable  Last Vitals:  Vitals Value Taken Time  BP    Temp    Pulse 104 05/10/19 1713  Resp 15 05/10/19 1713  SpO2 90 % 05/10/19 1713  Vitals shown include unvalidated device data.  Last Pain:  Vitals:   05/10/19 1500  TempSrc: Oral  PainSc: 0-No pain         Complications: No apparent anesthesia complications

## 2019-05-10 NOTE — Op Note (Signed)
Brenda Casey PROCEDURE DATE: 05/10/2019  PREOPERATIVE DIAGNOSES: IUFD, double footling breech baby partially delivered with head entrapment, Chorioamnionitis POSTOPERATIVE DIAGNOSES: The same PROCEDURE: Durhssen's Incision, delivery of baby, Uterine curettage, repair of cervical laceration SURGEON:  Dr. Candelaria Celeste ASSISTANT: Dr Catalina Antigua ANESTHESIOLOGIST: Dr Harlon Ditty  INDICATIONS: 34 y.o. [id number] at 34wks with IUFD due to severe growth restriction, with partial delivery of fetus with head entrapment. Attempts at cervical dilation were made using misoprostol and pitocin without improvement. She has chorioamnionitis and has been on antibiotics. She also has preeclampsia and has been on magnesium. Risks of surgery were discussed with the patient including but not limited to: bleeding which may require transfusion or reoperation; infection which may require antibiotics; injury to bowel, bladder, ureters or other surrounding organs; need for additional procedures; thromboembolic phenomenon, incisional problems and other postoperative/anesthesia complications. Written informed consent was obtained.    FINDINGS:  Fetal demise, approximately 26 week size. Placenta.  ANESTHESIA:    Epidural with sedation INTRAVENOUS FLUIDS: 600 ml ESTIMATED BLOOD LOSS: 50 ml URINE OUTPUT: 150 ml SPECIMENS: placenta COMPLICATIONS: None immediate  PROCEDURE IN DETAIL:  The patient received intravenous antibiotics and had sequential compression devices applied to her lower extremities while in the preoperative area.  She was then taken to the operating room where epidural anesthesia was administered and was found to be adequate.  She received preoperative antibiotics of gentamycin (already given as part of treatment of chorioamnionitis) and clindamycin. She was placed in the dorsal lithotomy position, and was prepped and draped in a sterile manner.  A Foley catheter had been previously inserted into her bladder  and attached to constant drainage. After an adequate timeout was performed, attention was turned to vagina and perineum. With retraction, the cervix was visualized and an incision was made with bandage scissors at 12 o'clock. With difficulty, the baby's head was gently teased out of the cervix and the deceased baby was fully delivered. The cord was cut and the baby was handed off. The placenta was not immediately visible. A pair of ring forceps was introduced into the uterus and the placenta was grasped blindly. The placenta was gently teased from the uterus in it's entirety and was intact. A curettage of the uterus was done with a banjo curette with a good uterine cry. The cervical incision was then repaired using 0 vicryl in a running locked manner with good hemostasis.   The patient received pitocin bolus, as well as TXA 1000mg .   Patient was stable and transported to the PACU.  , DO 05/10/2019 5:37 PM

## 2019-05-10 NOTE — Progress Notes (Addendum)
Inpatient Diabetes Program Recommendations  AACE/ADA: New Consensus Statement on Inpatient Glycemic Control (2015)  Target Ranges:  Prepandial:   less than 140 mg/dL      Peak postprandial:   less than 180 mg/dL (1-2 hours)      Critically ill patients:  140 - 180 mg/dL   Lab Results  Component Value Date   GLUCAP 150 (H) 05/09/2019   HGBA1C 8.0 (H) 05/09/2019    Review of Glycemic Control Results for ULLA, MCKIERNAN (MRN [id number]) as of 05/10/2019 08:05  Ref. Range 05/09/2019 18:26 05/09/2019 20:25  Glucose-Capillary Latest Ref Range: 70 - 99 mg/dL 774 (H) 128 (H)   Diabetes history: Type 2 Dm Outpatient Diabetes medications: Lantus 18 units QD Current orders for Inpatient glycemic control: novolog 0-20 units TID  Inpatient Diabetes Program Recommendations:    When appropriate, consider : - Lantus at 12 units QD -Adding Novolog 0-9 units Q4H until oral intake resumes. Discussed orders and plan of care with Dr Adrian Blackwater.   Will continue to follow.  Thanks, Lujean Rave, MSN, RNC-OB Diabetes Coordinator [phone] (8a-5p)

## 2019-05-11 ENCOUNTER — Encounter: Payer: Self-pay | Admitting: *Deleted

## 2019-05-11 LAB — CBC
HCT: 36.4 % (ref 36.0–46.0)
Hemoglobin: 12 g/dL (ref 12.0–15.0)
MCH: 27 pg (ref 26.0–34.0)
MCHC: 33 g/dL (ref 30.0–36.0)
MCV: 82 fL (ref 80.0–100.0)
Platelets: 265 10*3/uL (ref 150–400)
RBC: 4.44 MIL/uL (ref 3.87–5.11)
RDW: 14.1 % (ref 11.5–15.5)
WBC: 13.6 10*3/uL — ABNORMAL HIGH (ref 4.0–10.5)
nRBC: 0 % (ref 0.0–0.2)

## 2019-05-11 LAB — GLUCOSE, CAPILLARY
Glucose-Capillary: 150 mg/dL — ABNORMAL HIGH (ref 70–99)
Glucose-Capillary: 137 mg/dL — ABNORMAL HIGH (ref 70–99)
Glucose-Capillary: 100 mg/dL — ABNORMAL HIGH (ref 70–99)

## 2019-05-11 LAB — SYPHILIS: RPR W/REFLEX TO RPR TITER AND TREPONEMAL ANTIBODIES, TRADITIONAL SCREENING AND DIAGNOSIS ALGORITHM: RPR Ser Ql: NONREACTIVE

## 2019-05-11 NOTE — Anesthesia Postprocedure Evaluation (Signed)
Anesthesia Post Note  Patient: Brenda Casey  Procedure(s) Performed: AN AD HOC LABOR EPIDURAL     Patient location during evaluation: Mother Baby Anesthesia Type: Epidural Level of consciousness: awake and alert Pain management: pain level controlled Vital Signs Assessment: post-procedure vital signs reviewed and stable Respiratory status: spontaneous breathing, nonlabored ventilation and respiratory function stable Cardiovascular status: stable Postop Assessment: no headache, no backache and epidural receding Anesthetic complications: no    Last Vitals:  Vitals:   05/11/19 0300 05/11/19 0535  BP:  111/60  Pulse:  84  Resp: 20 18  Temp:  36.6 C  SpO2:  95%    Last Pain:  Vitals:   05/11/19 0650  TempSrc:   PainSc: 0-No pain                 Kahmari Herard

## 2019-05-11 NOTE — Plan of Care (Signed)
  Problem: Education: Goal: Knowledge of condition will improve Outcome: Progressing   Problem: Coping: Goal: Ability to identify and utilize appropriate coping strategies will improve Outcome: Progressing Goal: Ability to identify and utilize available support systems will improve Outcome: Progressing Goal: Will verbalize feelings Outcome: Progressing Goal: Decrease level of anxiety will Outcome: Progressing   Problem: Clinical Measurements: Goal: Will show no signs and symptoms of excessive bleeding Outcome: Progressing  Pt tearful, however states has a good support system with cousin and mom. Family member of pt was at bedside for part of the evening after pt transferred to [facility].

## 2019-05-11 NOTE — Anesthesia Preprocedure Evaluation (Signed)
Anesthesia Evaluation  Patient identified by MRN, date of birth, ID band Patient awake    Reviewed: Allergy & Precautions, H&P , NPO status , Patient's Chart, lab work & pertinent test results, reviewed documented beta blocker date and time   Airway Mallampati: II  TM Distance: >3 FB Neck ROM: full    Dental no notable dental hx.    Pulmonary neg pulmonary ROS,    Pulmonary exam normal breath sounds clear to auscultation       Cardiovascular hypertension, negative cardio ROS Normal cardiovascular exam Rhythm:regular Rate:Normal     Neuro/Psych negative neurological ROS  negative psych ROS   GI/Hepatic negative GI ROS, Neg liver ROS,   Endo/Other  negative endocrine ROSdiabetes  Renal/GU negative Renal ROS  negative genitourinary   Musculoskeletal   Abdominal   Peds  Hematology negative hematology ROS (+)   Anesthesia Other Findings   Reproductive/Obstetrics (+) Pregnancy                             Anesthesia Physical Anesthesia Plan  ASA: III  Anesthesia Plan: Epidural and MAC   Post-op Pain Management:    Induction:   PONV Risk Score and Plan:   Airway Management Planned:   Additional Equipment:   Intra-op Plan:   Post-operative Plan:   Informed Consent: I have reviewed the patients History and Physical, chart, labs and discussed the procedure including the risks, benefits and alternatives for the proposed anesthesia with the patient or authorized representative who has indicated his/her understanding and acceptance.     Dental Advisory Given  Plan Discussed with: Anesthesiologist and CRNA  Anesthesia Plan Comments: (Labs checked- platelets confirmed with RN in room. Fetal heart tracing, per RN, reported to be stable enough for sitting procedure. Discussed epidural, and patient consents to the procedure:  included risk of possible headache,backache, failed block,  allergic reaction, and nerve injury. This patient was asked if she had any questions or concerns before the procedure started.)        Anesthesia Quick Evaluation

## 2019-05-11 NOTE — Lactation Note (Signed)
Lactation Consultation Note  Patient Name: Brenda Casey YOVZC'H Date: 05/11/2019 Reason for consult: Other (Comment)(Fetal demise at 35 weeks - see LC note - spoke with RN)  LC spoke with RN Brenda Casey and provided the " Lactation after loss "  For mom and recommended if mom had desire to see LC prior to D/C tomorrow to call LC. [name] mentioned its part of loss check off to review breast care.    Maternal Data    Feeding    LATCH Score                   Interventions    Lactation Tools Discussed/Used     Consult Status Consult Status: Complete    Kathrin Greathouse 05/11/2019, 8:53 AM

## 2019-05-11 NOTE — Progress Notes (Signed)
Pt's CBG checked due to her not eating any breakfast or lunch. CBG 150. Pt states she will order lunch now. Will re-check CBG with meal and give insulin dose as appropriate.

## 2019-05-11 NOTE — Plan of Care (Signed)
  Problem: Education: Goal: Knowledge of disease or condition will improve Outcome: Progressing   

## 2019-05-11 NOTE — Addendum Note (Signed)
Addendum  created 05/11/19 1315 by Bethena Midget, MD   Attestation recorded in Intraprocedure, Flowsheet accepted, Intraprocedure Attestations filed

## 2019-05-11 NOTE — Progress Notes (Signed)
Post Partum Day 1 Subjective: no complaints and tolerating PO. Reports lochia as normal. Denies headache, vision changes. Patient is on magnesium.  Objective: Blood pressure 111/60, pulse 84, temperature 97.8 F (36.6 C), temperature source Oral, resp. rate 18, height 5\' 4"  (1.626 m), weight 131 kg, last menstrual period 08/31/2018, SpO2 95 %, unknown if currently breastfeeding.  Physical Exam:  General: alert, cooperative and no distress Lochia: appropriate Uterine Fundus: firm DVT Evaluation: No evidence of DVT seen on physical exam. Negative Homan's sign. No cords or calf tenderness.  Recent Labs    05/10/19 1639 05/11/19 0522  HGB 9.0* 12.0  HCT 28.1* 36.4    Assessment/Plan: 1. S/p Vaginal delivery 1. Lochia normal and good pain control 2. FGR with fetal demise 1. Grieving appropriately. 3. DM T2 1. Continue lantus 18 units at bedtime 2. CBGs and SSI with meals 4. CHTN with superimposed preeclampsia 1. Continue magnesium for 24 hours post delivery 2. BP low normal - hold labetalol and continue Procardia  Plan for discharge tomorrow   LOS: 2 days   05/13/19 05/11/2019, 7:16 AM

## 2019-05-11 NOTE — Anesthesia Postprocedure Evaluation (Signed)
Anesthesia Post Note  Patient: Brenda Casey  Procedure(s) Performed: DILATATION AND EVACUATION (N/A )     Patient location during evaluation: PACU Anesthesia Type: MAC and Epidural Level of consciousness: awake and alert Pain management: pain level controlled Vital Signs Assessment: post-procedure vital signs reviewed and stable Respiratory status: spontaneous breathing, nonlabored ventilation, respiratory function stable and patient connected to nasal cannula oxygen Cardiovascular status: stable and blood pressure returned to baseline Postop Assessment: no apparent nausea or vomiting Anesthetic complications: no    Last Vitals:  Vitals:   05/11/19 0300 05/11/19 0535  BP:  111/60  Pulse:  84  Resp: 20 18  Temp:  36.6 C  SpO2:  95%    Last Pain:  Vitals:   05/11/19 0650  TempSrc:   PainSc: 0-No pain                 Blakleigh Straw

## 2019-05-11 NOTE — Progress Notes (Signed)
I introduced spiritual care services to Brenda Casey.  Her parents were with her at the time of my visit.  Brenda Casey reported that at this time they are doing okay, but stated that she is open to a visit later.    Chaplain Dyanne Carrel, Bcc Pager, [phone] 3:04 PM    05/11/19 1500  Clinical Encounter Type  Visited With Patient and family together  Visit Type Follow-up  Spiritual Encounters  Spiritual Needs Emotional;Grief support

## 2019-05-12 LAB — GLUCOSE, CAPILLARY: Glucose-Capillary: 204 mg/dL — ABNORMAL HIGH (ref 70–99)

## 2019-05-12 LAB — SURGICAL PATHOLOGY

## 2019-05-12 MED ORDER — ENALAPRIL MALEATE 5 MG PO TABS: 10.0000 mg | ORAL_TABLET | Freq: Every day | ORAL | Status: DC

## 2019-05-12 MED ORDER — ENALAPRIL MALEATE 5 MG PO TABS
10.0000 mg | ORAL_TABLET | Freq: Every day | ORAL | Status: DC
  Administered 2019-05-12: 10 mg via ORAL
  Filled 2019-05-12: qty 2

## 2019-05-12 MED ORDER — ENALAPRIL MALEATE 10 MG PO TABS: 10.0000 mg | ORAL_TABLET | Freq: Every day | ORAL | 3 refills | Status: DC

## 2019-05-12 MED ORDER — ENALAPRIL MALEATE 5 MG PO TABS: 5.0000 mg | ORAL_TABLET | Freq: Every day | ORAL | Status: DC

## 2019-05-12 MED ORDER — ACETAMINOPHEN 325 MG PO TABS: 650.0000 mg | ORAL_TABLET | ORAL | 1 refills | Status: DC | PRN

## 2019-05-12 MED ORDER — IBUPROFEN 600 MG PO TABS: 600.0000 mg | ORAL_TABLET | Freq: Four times a day (QID) | ORAL | 0 refills | Status: DC

## 2019-05-12 MED FILL — ACETAMINOPHEN 325 MG TABS: 325 | 5 days supply | Qty: 60 | Fill #0

## 2019-05-12 MED FILL — ENALAPRIL MALEATE 10 MG TAB: 10 | 30 days supply | Qty: 30 | Fill #0

## 2019-05-12 MED FILL — IBUPROFEN 600 MG TABLET: 600 | 8 days supply | Qty: 30 | Fill #0

## 2019-05-12 NOTE — Progress Notes (Signed)
Pt discharged after d/c instructions given. All questions answered. Pt discharged via ambulation with all belongings. Pt in stable condition.

## 2019-05-12 NOTE — Discharge Instructions (Signed)
Hypertension During Pregnancy High blood pressure (hypertension) is when the force of blood pumping through the arteries is too strong. Arteries are blood vessels that carry blood from the heart throughout the body. Hypertension during pregnancy can be mild or severe. Severe hypertension during pregnancy (preeclampsia) is a medical emergency that requires prompt evaluation and treatment. Different types of hypertension can happen during pregnancy. These include:  Chronic hypertension. This happens when you had high blood pressure before you became pregnant, and it continues during the pregnancy. Hypertension that develops before you are [redacted] weeks pregnant and continues during the pregnancy is also called chronic hypertension. If you have chronic hypertension, it will not go away after you have your baby. You will need follow-up visits with your health care provider after you have your baby. Your doctor may want you to keep taking medicine for your blood pressure.  Gestational hypertension. This is hypertension that develops after the 20th week of pregnancy. Gestational hypertension usually goes away after you have your baby, but your health care provider will need to monitor your blood pressure to make sure that it is getting better.  Preeclampsia. This is severe hypertension during pregnancy. This can cause serious complications for you and your baby and can also cause complications for you after the delivery of your baby.  Postpartum preeclampsia. You may develop severe hypertension after giving birth. This usually occurs within 48 hours after childbirth but may occur up to 6 weeks after giving birth. This is rare. How does this affect me? Women who have hypertension during pregnancy have a greater chance of developing hypertension later in life or during future pregnancies. In some cases, hypertension during pregnancy can cause serious complications, such as:  Stroke.  Heart attack.  Injury to  other organs, such as kidneys, lungs, or liver.  Preeclampsia.  Convulsions or seizures.  Placental abruption. How does this affect my baby? Hypertension during pregnancy can affect your baby. Your baby may:  Be born early (prematurely).  Not weigh as much as he or she should at birth (low birth weight).  Not tolerate labor well, leading to an unplanned cesarean delivery. What are the risks? There are certain factors that make it more likely for you to develop hypertension during pregnancy. These include:  Having hypertension during a previous pregnancy.  Being overweight.  Being age 35 or older.  Being pregnant for the first time.  Being pregnant with more than one baby.  Becoming pregnant using fertilization methods, such as IVF (in vitro fertilization).  Having other medical problems, such as diabetes, kidney disease, or lupus.  Having a family history of hypertension. What can I do to lower my risk? The exact cause of hypertension during pregnancy is not known. You may be able to lower your risk by:  Maintaining a healthy weight.  Eating a healthy and balanced diet.  Following your health care provider's instructions about treating any long-term conditions that you had before becoming pregnant. It is very important to keep all of your prenatal care appointments. Your health care provider will check your blood pressure and make sure that your pregnancy is progressing as expected. If a problem is found, early treatment can prevent complications. How is this treated? Treatment for hypertension during pregnancy varies depending on the type of hypertension you have and how serious it is.  If you were taking medicine for high blood pressure before you became pregnant, talk with your health care provider. You may need to change medicine during pregnancy because   some medicines, like ACE inhibitors, may not be considered safe for your baby.  If you have gestational  hypertension, your health care provider may order medicine to treat this during pregnancy.  If you are at risk for preeclampsia, your health care provider may recommend that you take a low-dose aspirin during your pregnancy.  If you have severe hypertension, you may need to be hospitalized so you and your baby can be monitored closely. You may also need to be given medicine to lower your blood pressure. This medicine may be given by mouth or through an IV.  In some cases, if your condition gets worse, you may need to deliver your baby early. Follow these instructions at home: Eating and drinking   Drink enough fluid to keep your urine pale yellow.  Avoid caffeine. Lifestyle  Do not use any products that contain nicotine or tobacco, such as cigarettes, e-cigarettes, and chewing tobacco. If you need help quitting, ask your health care provider.  Do not use alcohol or drugs.  Avoid stress as much as possible.  Rest and get plenty of sleep.  Regular exercise can help to reduce your blood pressure. Ask your health care provider what kinds of exercise are best for you. General instructions  Take over-the-counter and prescription medicines only as told by your health care provider.  Keep all prenatal and follow-up visits as told by your health care provider. This is important. Contact a health care provider if:  You have symptoms that your health care provider told you may require more treatment or monitoring, such as: ? Headaches. ? Nausea or vomiting. ? Abdominal pain. ? Dizziness. ? Light-headedness. Get help right away if:  You have: ? Severe abdominal pain that does not get better with treatment. ? A severe headache that does not get better. ? Vomiting that does not get better. ? Sudden, rapid weight gain. ? Sudden swelling in your hands, ankles, or face. ? Vaginal bleeding. ? Blood in your urine. ? Blurred or double vision. ? Shortness of breath or chest  pain. ? Weakness on one side of your body. ? Difficulty speaking.  Your baby is not moving as much as usual. Summary  High blood pressure (hypertension) is when the force of blood pumping through the arteries is too strong.  Hypertension during pregnancy can cause problems for you and your baby.  Treatment for hypertension during pregnancy varies depending on the type of hypertension you have and how serious it is.  Keep all prenatal and follow-up visits as told by your health care provider. This is important. This information is not intended to replace advice given to you by your health care provider. Make sure you discuss any questions you have with your health care provider. Document Revised: 05/27/2018 Document Reviewed: 03/02/2018 Elsevier Patient Education  2020 [employer] Postpartum Care After Vaginal Delivery This sheet gives you information about how to care for yourself from the time you deliver your baby to up to 6-12 weeks after delivery (postpartum period). Your health care provider may also give you more specific instructions. If you have problems or questions, contact your health care provider. Follow these instructions at home: Vaginal bleeding  It is normal to have vaginal bleeding (lochia) after delivery. Wear a sanitary pad for vaginal bleeding and discharge. ? During the first week after delivery, the amount and appearance of lochia is often similar to a menstrual period. ? Over the next few weeks, it will gradually decrease to a dry, yellow-brown discharge. ?  For most women, lochia stops completely by 4-6 weeks after delivery. Vaginal bleeding can vary from woman to woman.  Change your sanitary pads frequently. Watch for any changes in your flow, such as: ? A sudden increase in volume. ? A change in color. ? Large blood clots.  If you pass a blood clot from your vagina, save it and call your health care provider to discuss. Do not flush blood clots down the  toilet before talking with your health care provider.  Do not use tampons or douches until your health care provider says this is safe.  If you are not breastfeeding, your period should return 6-8 weeks after delivery. If you are feeding your child breast milk only (exclusive breastfeeding), your period may not return until you stop breastfeeding. Perineal care  Keep the area between the vagina and the anus (perineum) clean and dry as told by your health care provider. Use medicated pads and pain-relieving sprays and creams as directed.  If you had a cut in the perineum (episiotomy) or a tear in the vagina, check the area for signs of infection until you are healed. Check for: ? More redness, swelling, or pain. ? Fluid or blood coming from the cut or tear. ? Warmth. ? Pus or a bad smell.  You may be given a squirt bottle to use instead of wiping to clean the perineum area after you go to the bathroom. As you start healing, you may use the squirt bottle before wiping yourself. Make sure to wipe gently.  To relieve pain caused by an episiotomy, a tear in the vagina, or swollen veins in the anus (hemorrhoids), try taking a warm sitz bath 2-3 times a day. A sitz bath is a warm water bath that is taken while you are sitting down. The water should only come up to your hips and should cover your buttocks. Breast care  Within the first few days after delivery, your breasts may feel heavy, full, and uncomfortable (breast engorgement). Milk may also leak from your breasts. Your health care provider can suggest ways to help relieve the discomfort. Breast engorgement should go away within a few days.  If you are breastfeeding: ? Wear a bra that supports your breasts and fits you well. ? Keep your nipples clean and dry. Apply creams and ointments as told by your health care provider. ? You may need to use breast pads to absorb milk that leaks from your breasts. ? You may have uterine contractions every  time you breastfeed for up to several weeks after delivery. Uterine contractions help your uterus return to its normal size. ? If you have any problems with breastfeeding, work with your health care provider or Ad[employer].  If you are not breastfeeding: ? Avoid touching your breasts a lot. Doing this can make your breasts produce more milk. ? Wear a good-fitting bra and use cold packs to help with swelling. ? Do not squeeze out (express) milk. This causes you to make more milk. Intimacy and sexuality  Ask your health care provider when you can engage in sexual activity. This may depend on: ? Your risk of infection. ? How fast you are healing. ? Your comfort and desire to engage in sexual activity.  You are able to get pregnant after delivery, even if you have not had your period. If desired, talk with your health care provider about methods of birth control (contraception). Medicines  Take over-the-counter and prescription medicines only as told by your  health care provider.  If you were prescribed an antibiotic medicine, take it as told by your health care provider. Do not stop taking the antibiotic even if you start to feel better. Activity  Gradually return to your normal activities as told by your health care provider. Ask your health care provider what activities are safe for you.  Rest as much as possible. Try to rest or take a nap while your baby is sleeping. Eating and drinking   Drink enough fluid to keep your urine pale yellow.  Eat high-fiber foods every day. These may help prevent or relieve constipation. High-fiber foods include: ? Whole grain cereals and breads. ? Brown rice. ? Beans. ? Fresh fruits and vegetables.  Do not try to lose weight quickly by cutting back on calories.  Take your prenatal vitamins until your postpartum checkup or until your health care provider tells you it is okay to stop. Lifestyle  Do not use any products that contain  nicotine or tobacco, such as cigarettes and e-cigarettes. If you need help quitting, ask your health care provider.  Do not drink alcohol, especially if you are breastfeeding. General instructions  Keep all follow-up visits for you and your baby as told by your health care provider. Most women visit their health care provider for a postpartum checkup within the first 3-6 weeks after delivery. Contact a health care provider if:  You feel unable to cope with the changes that your child brings to your life, and these feelings do not go away.  You feel unusually sad or worried.  Your breasts become red, painful, or hard.  You have a fever.  You have trouble holding urine or keeping urine from leaking.  You have little or no interest in activities you used to enjoy.  You have not breastfed at all and you have not had a menstrual period for 12 weeks after delivery.  You have stopped breastfeeding and you have not had a menstrual period for 12 weeks after you stopped breastfeeding.  You have questions about caring for yourself or your baby.  You pass a blood clot from your vagina. Get help right away if:  You have chest pain.  You have difficulty breathing.  You have sudden, severe leg pain.  You have severe pain or cramping in your lower abdomen.  You bleed from your vagina so much that you fill more than one sanitary pad in one hour. Bleeding should not be heavier than your heaviest period.  You develop a severe headache.  You faint.  You have blurred vision or spots in your vision.  You have bad-smelling vaginal discharge.  You have thoughts about hurting yourself or your baby. If you ever feel like you may hurt yourself or others, or have thoughts about taking your own life, get help right away. You can go to the nearest emergency department or call:  Your local emergency services (911 in the [place]  A suicide crisis helpline, such as the [facility] at 815-455-0162. This is open 24 hours a day. Summary  The period of time right after you deliver your newborn up to 6-12 weeks after delivery is called the postpartum period.  Gradually return to your normal activities as told by your health care provider.  Keep all follow-up visits for you and your baby as told by your health care provider. This information is not intended to replace advice given to you by your health care provider. Make sure you discuss  any questions you have with your health care provider. Document Revised: 02/06/2017 Document Reviewed: 11/17/2016 Elsevier Patient Education  2020 [employer].

## 2019-05-12 NOTE — Plan of Care (Signed)
  Problem: Health Behavior/Discharge Planning: Goal: Ability to manage health-related needs will improve Outcome: Adequate for Discharge   Problem: Clinical Measurements: Goal: Ability to maintain clinical measurements within normal limits will improve Outcome: Adequate for Discharge Goal: Will remain free from infection Outcome: Adequate for Discharge Goal: Diagnostic test results will improve Outcome: Adequate for Discharge Goal: Respiratory complications will improve Outcome: Adequate for Discharge Goal: Cardiovascular complication will be avoided Outcome: Adequate for Discharge   Problem: Activity: Goal: Risk for activity intolerance will decrease Outcome: Adequate for Discharge   Problem: Nutrition: Goal: Adequate nutrition will be maintained Outcome: Adequate for Discharge   Problem: Coping: Goal: Level of anxiety will decrease Outcome: Adequate for Discharge   Problem: Elimination: Goal: Will not experience complications related to bowel motility Outcome: Adequate for Discharge Goal: Will not experience complications related to urinary retention Outcome: Adequate for Discharge   Problem: Pain Managment: Goal: General experience of comfort will improve Outcome: Adequate for Discharge   Problem: Safety: Goal: Ability to remain free from injury will improve Outcome: Adequate for Discharge   Problem: Skin Integrity: Goal: Risk for impaired skin integrity will decrease Outcome: Adequate for Discharge   Problem: Education: Goal: Knowledge of condition will improve Outcome: Adequate for Discharge Goal: Individualized Educational Video(s) Outcome: Adequate for Discharge   Problem: Activity: Goal: Will verbalize the importance of balancing activity with adequate rest periods Outcome: Adequate for Discharge Goal: Ability to tolerate increased activity will improve Outcome: Adequate for Discharge   Problem: Coping: Goal: Ability to identify and utilize available  resources and services will improve Outcome: Adequate for Discharge   Problem: Life Cycle: Goal: Chance of risk for complications during the postpartum period will decrease Outcome: Adequate for Discharge   Problem: Role Relationship: Goal: Ability to demonstrate positive interaction with newborn will improve Outcome: Adequate for Discharge   Problem: Skin Integrity: Goal: Demonstration of wound healing without infection will improve Outcome: Adequate for Discharge   Problem: Education: Goal: Knowledge of disease or condition will improve Outcome: Adequate for Discharge Goal: Knowledge of the prescribed therapeutic regimen will improve Outcome: Adequate for Discharge   Problem: Coping: Goal: Ability to identify and utilize appropriate coping strategies will improve Outcome: Adequate for Discharge Goal: Ability to identify and utilize available support systems will improve Outcome: Adequate for Discharge Goal: Will verbalize feelings Outcome: Adequate for Discharge Goal: Decrease level of anxiety will Outcome: Adequate for Discharge   Problem: Clinical Measurements: Goal: Will show no signs and symptoms of excessive bleeding Outcome: Adequate for Discharge

## 2019-05-12 NOTE — Progress Notes (Signed)
I offered support to [place] as well as to her sister.  Provided them with resources for ongoing support and we talked about the grief process and coping skills.  [employer] of presence.  Chaplain Dyanne Carrel, Bcc Pager, [phone] 2:35 PM

## 2019-05-16 ENCOUNTER — Encounter: Payer: Self-pay | Admitting: Anesthesiology

## 2019-05-19 ENCOUNTER — Other Ambulatory Visit: Payer: Self-pay

## 2019-05-19 ENCOUNTER — Ambulatory Visit (INDEPENDENT_AMBULATORY_CARE_PROVIDER_SITE_OTHER): Payer: Medicaid Other | Admitting: Obstetrics and Gynecology

## 2019-05-19 ENCOUNTER — Encounter: Payer: Self-pay | Admitting: Obstetrics and Gynecology

## 2019-05-19 DIAGNOSIS — Z8759 Personal history of other complications of pregnancy, childbirth and the puerperium: Secondary | ICD-10-CM

## 2019-05-19 DIAGNOSIS — O24119 Pre-existing diabetes mellitus, type 2, in pregnancy, unspecified trimester: Secondary | ICD-10-CM

## 2019-05-19 MED ORDER — BD PEN NEEDLE NANO U/F 32G X 4 MM MISC: 2 refills | Status: AC

## 2019-05-19 NOTE — BH Specialist Note (Signed)
Integrated Behavioral Health via Telemedicine Phone Visit  05/19/2019 Addyson Traub [id number]  Number of Integrated Behavioral Health visits: 1 Session Start time: 8:52  Session End time: 9:35 Total time: 77  Referring Provider: Nettie Elm Type of Visit: Phone Patient/Family location: Home [facility] Provider location: [facility] All persons participating in visit: Patient Shawonda Kerce and [facility] Cielo Arias    Confirmed patient's address: Yes  Confirmed patient's phone number: Yes  Any changes to demographics: No   Confirmed patient's insurance: Yes  Any changes to patient's insurance: No   Discussed confidentiality: Yes   I connected with Anjannette Gauger a video enabled telemedicine application and verified that I am speaking with the correct person using two identifiers.     I discussed the limitations of evaluation and management by telemedicine and the availability of in person appointments.  I discussed that the purpose of this visit is to provide behavioral health care while limiting exposure to the novel coronavirus.   Discussed there is a possibility of technology failure and discussed alternative modes of communication if that failure occurs.  I discussed that engaging in this video/phone visit, they consent to the provision of behavioral healthcare and the services will be billed under their insurance.  Patient and/or legal guardian expressed understanding and consented to video/phone visit: Yes   PRESENTING CONCERNS: Patient and/or family reports the following symptoms/concerns: Pt's primary concern today is grieving the loss of her baby at [redacted] weeks gestation.  Duration of problem: About 2 weeks;   STRENGTHS (Protective Factors/Coping Skills): Strong social support   GOALS ADDRESSED: Patient will: 1.  Increase knowledge and/or ability of: healthy habits  2.  Demonstrate ability to: Increase healthy adjustment to current life circumstances and Continue  healthy grieving over loss  INTERVENTIONS: Interventions utilized:  Supportive Counseling and Sleep Hygiene Standardized Assessments completed: Not Needed  ASSESSMENT: Patient currently experiencing Grief.   Patient may benefit from psychoeducation and brief therapeutic interventions regarding coping with symptoms of normal grief .  PLAN: 1. Follow up with behavioral health clinician on : One month 2. Behavioral recommendations:  -Continue allowing feelings of grief to come -Continue allowing family and friends to be practically and emotionally supportive -Consider taking melatonin (over-the-counter) nightly to help improve sleep during this time  -Consider using sleep app, as discussed, for help with sleep -Consider additional grief resources (on After Visit Summary) as needed 3. Referral(s): Integrated [employer] (In Clinic)  I discussed the assessment and treatment plan with the patient and/or parent/guardian. They were provided an opportunity to ask questions and all were answered. They agreed with the plan and demonstrated an understanding of the instructions.   They were advised to call back or seek an in-person evaluation if the symptoms worsen or if the condition fails to improve as anticipated.  Valetta Close Mareta Chesnut

## 2019-05-19 NOTE — Progress Notes (Deleted)
Subjective:     Brenda Casey is a 34 y.o. female who presents for a postpartum visit. She is {1-10:13787} {time; units:18646} postpartum following a {delivery:12449}. I have fully reviewed the prenatal and intrapartum course. The delivery was at *** gestational weeks. Outcome: {delivery outcome:32078}. Anesthesia: {anesthesia types:812}. Postpartum course has been ***. Baby's course has been ***. Baby is feeding by {breast/bottle:69}. Bleeding {vag bleed:12292}. Bowel function is {normal:32111}. Bladder function is {normal:32111}. Patient {is/is not:9024} sexually active. Contraception method is {contraceptive method:5051}. Postpartum depression screening: {neg default:13464::"negative"}.  {Common ambulatory SmartLinks:19316}  Review of Systems {ros; complete:30496}   Objective:    LMP 08/31/2018   General:  {gen appearance:16600}   Breasts:  {breast exam:1202::"inspection negative, no nipple discharge or bleeding, no masses or nodularity palpable"}  Lungs: {lung exam:16931}  Heart:  {heart exam:5510}  Abdomen: {abdomen exam:16834}   Vulva:  {labia exam:12198}  Vagina: {vagina exam:12200}  Cervix:  {cervix exam:14595}  Corpus: {uterus exam:12215}  Adnexa:  {adnexa exam:12223}  Rectal Exam: {rectal/vaginal exam:12274}        Assessment:    *** postpartum exam. Pap smear {done:10129} at today's visit.   Plan:    1. Contraception: {method:5051} 2. *** 3. Follow up in: {1-10:13787} {time; units:19136} or as needed.

## 2019-05-19 NOTE — Progress Notes (Signed)
Pt states is still bleeding ,for the last few days it has been heavy, no clots.

## 2019-05-19 NOTE — Progress Notes (Signed)
Patient ID: Brenda Casey, female   DOB: 11-30-1985, 34 y.o.   MRN: [id number] [name] presents for hospital follow up d/t to IUFD on 05/09/19 Operative vaginal delivery on 05/10/19 d/t to double footling with head entrapment. Delivered after 12 o'clock Durhssen's incision.  SPIC and chorio. She received antibiotics and magnesium x 24 hrs DM management with Novo Pen and Lantus 18 units qd Discharge 05/12/19 on Enalapril and insulin regiment as noted above  She reports no bowel or bladder dysfunction. Still with lochia. Denies pain or fever Reports CBG's normal  She is still coping with the recent loss. Sleeping OK. Has appt with [name] on April 5  PE AF BP 141/99 Lungs clear Heart RRR Abd soft + BS  A/P S/P IUFD       CHTN with [facility]       DM  Will continue with Enalapril and insulin regiment. Briefly discussed IUFD in setting of CHTN, IUGR and DM. Dicussed MFM consult early in next pregnancy or even before next pregnancy to discuss management options. Encouraged to keep appt with [name]. Will schedule PP visit in 3 weeks  Pt also has BP check appt next week

## 2019-05-23 ENCOUNTER — Ambulatory Visit (INDEPENDENT_AMBULATORY_CARE_PROVIDER_SITE_OTHER): Payer: Medicaid Other | Admitting: Clinical

## 2019-05-23 DIAGNOSIS — F4321 Adjustment disorder with depressed mood: Secondary | ICD-10-CM

## 2019-05-23 NOTE — Patient Instructions (Signed)
Hello Brenda Casey, These are resources that have been helpful to women and families experiencing grief:   [place] virtual bereavement support group will be facilitated by pastoral care and perinatal education.  To start, this group will meet once a month. The registration location for this support group is on [place]'s website > your wellbeing > classes and support groups > support groups  [facility] (Individual and group grief support)   [facility]  [phone]   Also:  www.BrideEmporium.nl  www.nationalshare.org  www.missfoundation.org  www.nilmdts.org        www.stillstandingmag.com  TastyShow.cz   /Emotional Wellbeing Apps and Websites Here are a few free apps meant to help you to help yourself.  To find, try searching on the internet to see if the app is offered on Apple/Android devices. If your first choice doesn't come up on your device, the good news is that there are many choices! Play around with different apps to see which ones are helpful to you.    Calm This is an app meant to help increase calm feelings. Includes info, strategies, and tools for tracking your feelings.      Calm Harm  This app is meant to help with self-harm. Provides many 5-minute or 15-min coping strategies for doing instead of hurting yourself.       Healthy Minds Health Minds is a problem-solving tool to help deal with emotions and cope with stress you encounter wherever you are.      MindShift This app can help people cope with anxiety. Rather than trying to avoid anxiety, you can make an important shift and face it.      MY3  MY3 features a support system, safety plan and resources with the goal of offering a tool to use in a time of need.       My Life My Voice  This mood journal offers a simple solution for tracking your thoughts, feelings and moods. Animated emoticons can help identify your mood.       Relax Melodies Designed to help with sleep, on this app you  can mix sounds and meditations for relaxation.      Smiling Mind Smiling Mind is meditation made easy: it's a simple tool that helps put a smile on your mind.        Stop, Breathe & Think  A friendly, simple guide for people through meditations for mindfulness and compassion.  Stop, Breathe and Think Kids Enter your current feelings and choose a "mission" to help you cope. Offers videos for certain moods instead of just sound recordings.       Team Orange The goal of this tool is to help teens change how they think, act, and react. This app helps you focus on your own good feelings and experiences.      The United Stationers Box The United Stationers Box (VHB) contains simple tools to help patients with coping, relaxation, distraction, and positive thinking.

## 2019-05-24 ENCOUNTER — Telehealth: Payer: Self-pay | Admitting: *Deleted

## 2019-05-24 NOTE — Telephone Encounter (Signed)
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 States she was recently advised she had a stillbirth on 05/10/19. She states part of her process is she needs confirmation of that date and if was vaginal or c/s delivery. Brenda Casey

## 2019-05-25 NOTE — Telephone Encounter (Signed)
I returned the call and provided the delivery information requested.

## 2019-05-26 ENCOUNTER — Encounter: Payer: Self-pay | Admitting: *Deleted

## 2019-05-26 ENCOUNTER — Other Ambulatory Visit: Payer: Self-pay

## 2019-05-26 ENCOUNTER — Ambulatory Visit (INDEPENDENT_AMBULATORY_CARE_PROVIDER_SITE_OTHER): Payer: Medicaid Other | Admitting: *Deleted

## 2019-05-26 VITALS — BP 139/96 | HR 118 | Temp 98.7°F | Ht 64.5 in | Wt 270.5 lb

## 2019-05-26 DIAGNOSIS — Z0130 Encounter for examination of blood pressure without abnormal findings: Secondary | ICD-10-CM

## 2019-05-26 MED ORDER — BASAGLAR KWIKPEN 100 UNIT/ML ~~LOC~~ SOPN: 18.0000 [IU] | PEN_INJECTOR | Freq: Every day | SUBCUTANEOUS | 2 refills | Status: AC

## 2019-05-26 NOTE — Progress Notes (Signed)
Pt seen for nurse visit @ 1350 today. She has been checking her BP @ home daily and reports some results are 140's systolic and 80-90's diastolic. Most values are lower than 140/90. She denies H/A or visual disturbances. Pt reports no pain and having minimal vaginal spotting. Per medication review, pt states she has been taking Basaglar insulin - not Lantus as prescribed on 2/2 and again recommended @ discharge from hospital on 3/25. She further states that she knew the dosage was changed to 18 units daily but did not realize the type of insulin was changed. She ran out of insulin yesterday and requests refill. Per consult w/Dr. Alysia Penna, pt is to continue taking Enalapril 10 mg daily and Rx for Basaglar sent to pt's pharmacy since she has been taking this all along. Pt was advised to schedule appt with her PCP for follow up of her BP and Diabetes. She has PP appt in office scheduled on 4/22. Pt voiced understanding of all information and instructions given.

## 2019-05-26 NOTE — Progress Notes (Signed)
Agree with A & P. 

## 2019-06-01 DIAGNOSIS — Z029 Encounter for administrative examinations, unspecified: Secondary | ICD-10-CM

## 2019-06-09 ENCOUNTER — Ambulatory Visit (INDEPENDENT_AMBULATORY_CARE_PROVIDER_SITE_OTHER): Payer: Medicaid Other | Admitting: Obstetrics and Gynecology

## 2019-06-09 ENCOUNTER — Other Ambulatory Visit: Payer: Self-pay

## 2019-06-09 ENCOUNTER — Encounter: Payer: Self-pay | Admitting: Family Medicine

## 2019-06-09 ENCOUNTER — Encounter: Payer: Self-pay | Admitting: Obstetrics and Gynecology

## 2019-06-09 VITALS — BP 157/101 | HR 88 | Ht 64.5 in | Wt 270.8 lb

## 2019-06-09 DIAGNOSIS — E119 Type 2 diabetes mellitus without complications: Secondary | ICD-10-CM

## 2019-06-09 DIAGNOSIS — O24119 Pre-existing diabetes mellitus, type 2, in pregnancy, unspecified trimester: Secondary | ICD-10-CM

## 2019-06-09 DIAGNOSIS — I10 Essential (primary) hypertension: Secondary | ICD-10-CM

## 2019-06-09 MED ORDER — ENALAPRIL MALEATE 20 MG PO TABS: 20.0000 mg | ORAL_TABLET | Freq: Every day | ORAL | 1 refills | Status: DC

## 2019-06-09 NOTE — Progress Notes (Signed)
    Post Partum Visit Note  Brenda Casey is a 34 y.o. G27P0120 female who presents for a postpartum visit. She is 4 weeks postpartum following a vacuum-assisted vaginal delivery for FDIU. See admission note for additional information. Postpartum course complicated by [facility] and Type 2 DM. Has seen Jamine and has follow up ppt in 2 weeks. Bleeding moderate lochia. Bowel function is diarhea. Bladder function is normal. Patient is not sexually active. Contraception method is none. Postpartum depression screening: positive.   The following portions of the patient's history were reviewed and updated as appropriate: allergies, current medications, past family history, past medical history, past social history, past surgical history and problem list. Review of Systems Pertinent items noted in HPI and remainder of comprehensive ROS otherwise negative.    Objective:  Last menstrual period 08/31/2018, unknown if currently breastfeeding.  General:  alert   Breasts:  deffered  Lungs: clear to auscultation bilaterally  Heart:  regular rate and rhythm, S1, S2 normal, no murmur, click, rub or gallop  Abdomen: soft, non-tender; bowel sounds normal; no masses,  no organomegaly   Vulva:  not evaluated  Vagina: not evaluated  Cervix:  not evaluated  Corpus: not examined  Adnexa:  not evaluated  Rectal Exam: Not performed.        Assessment:    Nl postpartum exam. Pap smear not done at today's visit.   CHTN Type 2 DM  Plan:   Essential components of care per ACOG recommendations:  1.  Mood and well being: Patient with positive depression screening today. Reviewed local resources for support. Hass appt with [name] - Patient does not use tobacco. - hx of drug use? No     3. Sexuality, contraception and birth spacing - Patient does not know want a pregnancy in the next year.  Desired family size is uncrtain children.  - Reviewed forms of contraception in tiered fashion. Patient desired no method  today.   - Discussed birth spacing of 18 months  4. Sleep and fatigue -Encouraged family/partner/community support of 4 hrs of uninterrupted sleep to help with mood and fatigue  5. Physical Recovery  - Discussed patients delivery and complications  Patient expressed understanding - Patient has urinary incontinence? No- Patient is safe to resume physical and sexual activity  6.  Health Maintenance - Last pap smear done 10/20 and was normal with negative HPV.   7.Chronic Disease - PCP follow up Pt sees Family Medicine at [facility]. Encouraged to schedule follow up ppt. Will continue with current insulin regiment. Will increase Vasotec to 20 mg daily. New Rx sent to pharmacy. Discussed with Dr. Adrian Blackwater.  Henrietta Dine, CMA [facility] [employer], [facility]

## 2019-06-09 NOTE — Progress Notes (Signed)
Pt scored 15 on [place] but doesn't want to talk with therapist, states already has talked with someone.

## 2019-06-09 NOTE — Patient Instructions (Signed)
Health Maintenance, Female Adopting a healthy lifestyle and getting preventive care are important in promoting health and wellness. Ask your health care provider about:  The right schedule for you to have regular tests and exams.  Things you can do on your own to prevent diseases and keep yourself healthy. What should I know about diet, weight, and exercise? Eat a healthy diet   Eat a diet that includes plenty of vegetables, fruits, low-fat dairy products, and lean protein.  Do not eat a lot of foods that are high in solid fats, added sugars, or sodium. Maintain a healthy weight Body mass index (BMI) is used to identify weight problems. It estimates body fat based on height and weight. Your health care provider can help determine your BMI and help you achieve or maintain a healthy weight. Get regular exercise Get regular exercise. This is one of the most important things you can do for your health. Most adults should:  Exercise for at least 150 minutes each week. The exercise should increase your heart rate and make you sweat (moderate-intensity exercise).  Do strengthening exercises at least twice a week. This is in addition to the moderate-intensity exercise.  Spend less time sitting. Even light physical activity can be beneficial. Watch cholesterol and blood lipids Have your blood tested for lipids and cholesterol at 33 years of age, then have this test every 5 years. Have your cholesterol levels checked more often if:  Your lipid or cholesterol levels are high.  You are older than 33 years of age.  You are at high risk for heart disease. What should I know about cancer screening? Depending on your health history and family history, you may need to have cancer screening at various ages. This may include screening for:  Breast cancer.  Cervical cancer.  Colorectal cancer.  Skin cancer.  Lung cancer. What should I know about heart disease, diabetes, and high blood  pressure? Blood pressure and heart disease  High blood pressure causes heart disease and increases the risk of stroke. This is more likely to develop in people who have high blood pressure readings, are of African descent, or are overweight.  Have your blood pressure checked: ? Every 3-5 years if you are 18-39 years of age. ? Every year if you are 40 years old or older. Diabetes Have regular diabetes screenings. This checks your fasting blood sugar level. Have the screening done:  Once every three years after age 40 if you are at a normal weight and have a low risk for diabetes.  More often and at a younger age if you are overweight or have a high risk for diabetes. What should I know about preventing infection? Hepatitis B If you have a higher risk for hepatitis B, you should be screened for this virus. Talk with your health care provider to find out if you are at risk for hepatitis B infection. Hepatitis C Testing is recommended for:  Everyone born from 1945 through 1965.  Anyone with known risk factors for hepatitis C. Sexually transmitted infections (STIs)  Get screened for STIs, including gonorrhea and chlamydia, if: ? You are sexually active and are younger than 33 years of age. ? You are older than 33 years of age and your health care provider tells you that you are at risk for this type of infection. ? Your sexual activity has changed since you were last screened, and you are at increased risk for chlamydia or gonorrhea. Ask your health care provider if   you are at risk.  Ask your health care provider about whether you are at high risk for HIV. Your health care provider may recommend a prescription medicine to help prevent HIV infection. If you choose to take medicine to prevent HIV, you should first get tested for HIV. You should then be tested every 3 months for as long as you are taking the medicine. Pregnancy  If you are about to stop having your period (premenopausal) and  you may become pregnant, seek counseling before you get pregnant.  Take 400 to 800 micrograms (mcg) of folic acid every day if you become pregnant.  Ask for birth control (contraception) if you want to prevent pregnancy. Osteoporosis and menopause Osteoporosis is a disease in which the bones lose minerals and strength with aging. This can result in bone fractures. If you are 65 years old or older, or if you are at risk for osteoporosis and fractures, ask your health care provider if you should:  Be screened for bone loss.  Take a calcium or vitamin D supplement to lower your risk of fractures.  Be given hormone replacement therapy (HRT) to treat symptoms of menopause. Follow these instructions at home: Lifestyle  Do not use any products that contain nicotine or tobacco, such as cigarettes, e-cigarettes, and chewing tobacco. If you need help quitting, ask your health care provider.  Do not use street drugs.  Do not share needles.  Ask your health care provider for help if you need support or information about quitting drugs. Alcohol use  Do not drink alcohol if: ? Your health care provider tells you not to drink. ? You are pregnant, may be pregnant, or are planning to become pregnant.  If you drink alcohol: ? Limit how much you use to 0-1 drink a day. ? Limit intake if you are breastfeeding.  Be aware of how much alcohol is in your drink. In the [place] one drink equals one 12 oz bottle of beer (355 mL), one 5 oz glass of wine (148 mL), or one 1 oz glass of hard liquor (44 mL). General instructions  Schedule regular health, dental, and eye exams.  Stay current with your vaccines.  Tell your health care provider if: ? You often feel depressed. ? You have ever been abused or do not feel safe at home. Summary  Adopting a healthy lifestyle and getting preventive care are important in promoting health and wellness.  Follow your health care provider's instructions about healthy  diet, exercising, and getting tested or screened for diseases.  Follow your health care provider's instructions on monitoring your cholesterol and blood pressure. This information is not intended to replace advice given to you by your health care provider. Make sure you discuss any questions you have with your health care provider. Document Revised: 01/27/2018 Document Reviewed: 01/27/2018 Elsevier Patient Education  2020 Elsevier Inc.  

## 2019-06-20 NOTE — BH Specialist Note (Signed)
Pt did not arrive to video visit and did not answer the phone ; Left HIPPA-compliant message to call back Brenda Casey from [facility] [employer] at [facility] at [phone] (main office) or [phone] (Bishop Vanderwerf's office). ; left MyChart message for patient.    Integrated Behavioral Health via Telemedicine Video Visit  06/20/2019 [name] [phone]  [name]

## 2019-06-22 ENCOUNTER — Ambulatory Visit (INDEPENDENT_AMBULATORY_CARE_PROVIDER_SITE_OTHER): Payer: Medicaid Other | Admitting: Clinical

## 2019-06-22 DIAGNOSIS — Z91199 Patient's noncompliance with other medical treatment and regimen due to unspecified reason: Secondary | ICD-10-CM

## 2019-06-22 DIAGNOSIS — Z5329 Procedure and treatment not carried out because of patient's decision for other reasons: Secondary | ICD-10-CM

## 2019-06-28 ENCOUNTER — Encounter: Payer: Self-pay | Admitting: General Practice

## 2019-07-11 ENCOUNTER — Other Ambulatory Visit: Payer: Self-pay | Admitting: Obstetrics and Gynecology

## 2019-07-11 DIAGNOSIS — I10 Essential (primary) hypertension: Secondary | ICD-10-CM

## 2019-07-11 NOTE — Telephone Encounter (Signed)
This request came to [facility] but think she has been seen in your office. Please review refill for Enalapril 20mg .

## 2019-07-11 NOTE — Telephone Encounter (Signed)
Called pt regarding refill for Enalapril. Pt was advised that one refill was authorized at the time of original Rx on 4/22. Pt stated that when she was given the Rx on 4/22, she finished out her previous supply of Enalapril 10mg  and was taking 2 tablets instead of one. Therefore she did not need the new Rx immediately. She stated that she did not request the refill yet and she is aware that she may request from her pharmacy when needed. Per chart review, the refill was requested by [employer]. Pt was reminded that she needs appt w/her PCP for ongoing management of her hypertension. Pt had also sent a message regarding the extension of her FMLA to 07/25/19. She was advised that I will call her [employer] insurance regarding the extension of her FMLA to 07/25/19. Per Dr. 09/24/19, pt may have extension of FMLA due to emotional and physical needs.   [address] [place] @ [phone]. She was not available and a message was left with a co-worker stating that the FMLA extension has been approved by Dr. [phone]. If Adrian Blackwater has questions, she may call back.

## 2019-07-13 ENCOUNTER — Telehealth: Payer: Self-pay | Admitting: Family Medicine

## 2019-07-13 NOTE — Telephone Encounter (Signed)
Spoke with Jonea at [phone] about patient's short term disability claim. Jonea requested patient's 4/22 office note as well as a letter with the date that the patient would be returning to work. Both the letter and office visit note from 4/22 were faxed to [phone] on 07/13/19.

## 2019-08-02 ENCOUNTER — Telehealth: Payer: Self-pay | Admitting: Family Medicine

## 2019-08-02 NOTE — Telephone Encounter (Signed)
Patient called and requested to speak with [name]. I informed her she was not in the office, and I noticed she had missed her last appointment. I told the patient I could get her rescheduled. The patient stated she did not want to be reschedule, but that she just wanted [name] to give her a call. Sending message to [place].

## 2019-08-03 ENCOUNTER — Telehealth: Payer: Self-pay | Admitting: Clinical

## 2019-08-03 NOTE — Telephone Encounter (Signed)
Left HIPPA-compliant message to call back Esmeralda Blanford from [facility] at [facility] at  [phone] (Nichols Corter's office).   

## 2019-08-09 ENCOUNTER — Telehealth: Payer: Self-pay | Admitting: Clinical

## 2019-08-09 NOTE — Telephone Encounter (Signed)
Left HIPPA-compliant message to call back [name] from [employer] for [employer] at [facility] at [phone] [facility] office).

## 2019-08-11 ENCOUNTER — Other Ambulatory Visit: Payer: Self-pay | Admitting: Obstetrics and Gynecology

## 2019-08-11 DIAGNOSIS — I10 Essential (primary) hypertension: Secondary | ICD-10-CM

## 2020-11-21 ENCOUNTER — Emergency Department (HOSPITAL_BASED_OUTPATIENT_CLINIC_OR_DEPARTMENT_OTHER)
Admission: EM | Admit: 2020-11-21 | Discharge: 2020-11-22 | Disposition: A | Discharge status: Home / Self Care | Payer: 59 | Attending: Emergency Medicine | Admitting: Emergency Medicine

## 2020-11-21 ENCOUNTER — Other Ambulatory Visit: Payer: Self-pay

## 2020-11-21 ENCOUNTER — Encounter (HOSPITAL_BASED_OUTPATIENT_CLINIC_OR_DEPARTMENT_OTHER): Payer: Self-pay

## 2020-11-21 ENCOUNTER — Emergency Department (HOSPITAL_BASED_OUTPATIENT_CLINIC_OR_DEPARTMENT_OTHER): Payer: 59

## 2020-11-21 DIAGNOSIS — E1165 Type 2 diabetes mellitus with hyperglycemia: Secondary | ICD-10-CM

## 2020-11-21 DIAGNOSIS — S161XXS Strain of muscle, fascia and tendon at neck level, sequela: Secondary | ICD-10-CM

## 2020-11-21 DIAGNOSIS — Z794 Long term (current) use of insulin: Secondary | ICD-10-CM | POA: Insufficient documentation

## 2020-11-21 DIAGNOSIS — I10 Essential (primary) hypertension: Secondary | ICD-10-CM | POA: Insufficient documentation

## 2020-11-21 DIAGNOSIS — E039 Hypothyroidism, unspecified: Secondary | ICD-10-CM | POA: Insufficient documentation

## 2020-11-21 DIAGNOSIS — Z20822 Contact with and (suspected) exposure to covid-19: Secondary | ICD-10-CM | POA: Insufficient documentation

## 2020-11-21 DIAGNOSIS — Z79899 Other long term (current) drug therapy: Secondary | ICD-10-CM | POA: Diagnosis not present

## 2020-11-21 DIAGNOSIS — E119 Type 2 diabetes mellitus without complications: Secondary | ICD-10-CM | POA: Insufficient documentation

## 2020-11-21 DIAGNOSIS — M542 Cervicalgia: Secondary | ICD-10-CM | POA: Insufficient documentation

## 2020-11-21 LAB — BASIC METABOLIC PANEL
Anion gap: 8 (ref 5–15)
BUN: 11 mg/dL (ref 6–20)
CO2: 25 mmol/L (ref 22–32)
Calcium: 9.1 mg/dL (ref 8.9–10.3)
Chloride: 101 mmol/L (ref 98–111)
Creatinine, Ser: 0.79 mg/dL (ref 0.44–1.00)
GFR, Estimated: >60 mL/min (ref >60)
Glucose, Bld: 365 mg/dL — ABNORMAL HIGH (ref 70–99)
Potassium: 3.9 mmol/L (ref 3.5–5.1)
Sodium: 134 mmol/L — ABNORMAL LOW (ref 135–145)

## 2020-11-21 LAB — CBC WITH DIFFERENTIAL/PLATELET
Abs Immature Granulocytes: 0.03 10*3/uL (ref 0.00–0.07)
Basophils Absolute: 0 10*3/uL (ref 0.0–0.1)
Basophils Relative: 1 %
Eosinophils Absolute: 0.2 10*3/uL (ref 0.0–0.5)
Eosinophils Relative: 2 %
HCT: 45 % (ref 36.0–46.0)
Hemoglobin: 15.4 g/dL — ABNORMAL HIGH (ref 12.0–15.0)
Immature Granulocytes: 0 %
Lymphocytes Relative: 42 %
Lymphs Abs: 3.4 10*3/uL (ref 0.7–4.0)
MCH: 27.2 pg (ref 26.0–34.0)
MCHC: 34.2 g/dL (ref 30.0–36.0)
MCV: 79.5 fL — ABNORMAL LOW (ref 80.0–100.0)
Monocytes Absolute: 0.5 10*3/uL (ref 0.1–1.0)
Monocytes Relative: 6 %
Neutro Abs: 4.1 10*3/uL (ref 1.7–7.7)
Neutrophils Relative %: 49 %
Platelets: 281 10*3/uL (ref 150–400)
RBC: 5.66 MIL/uL — ABNORMAL HIGH (ref 3.87–5.11)
RDW: 13.3 % (ref 11.5–15.5)
WBC: 8.1 10*3/uL (ref 4.0–10.5)
nRBC: 0 % (ref 0.0–0.2)

## 2020-11-21 LAB — PREGNANCY, URINE: Preg Test, Ur: NEGATIVE

## 2020-11-21 MED ORDER — LIDOCAINE 5 % EX PTCH
1.0000 | MEDICATED_PATCH | CUTANEOUS | Status: DC
  Administered 2020-11-21: 1 via TRANSDERMAL
  Filled 2020-11-21: qty 1

## 2020-11-21 MED ORDER — ACETAMINOPHEN 500 MG PO TABS
1000.0000 mg | ORAL_TABLET | Freq: Once | ORAL | Status: AC
  Administered 2020-11-21: 1000 mg via ORAL
  Filled 2020-11-21: qty 2

## 2020-11-21 MED ORDER — IOHEXOL 350 MG/ML SOLN
75.0000 mL | Freq: Once | INTRAVENOUS | Status: AC | PRN
  Administered 2020-11-21: 75 mL via INTRAVENOUS

## 2020-11-21 MED ORDER — AMLODIPINE BESYLATE 5 MG PO TABS
10.0000 mg | ORAL_TABLET | Freq: Once | ORAL | Status: AC
  Administered 2020-11-21: 10 mg via ORAL
  Filled 2020-11-21: qty 2

## 2020-11-21 NOTE — ED Provider Notes (Signed)
[facility] EMERGENCY DEPARTMENT Provider Note   CSN: [id number] Arrival date & time: 11/21/20  2208     History Chief Complaint  Patient presents with   Neck Pain    Brenda Casey is a 35 y.o. female.  The history is provided by the patient.  Neck Pain Pain location:  L side Quality:  Cramping Pain radiates to:  Does not radiate Pain severity:  Moderate Pain is:  Same all the time Onset quality:  Sudden Duration:  3 days Timing:  Constant Progression:  Unchanged Chronicity:  New Context: not fall, not jumping from heights, not lifting a heavy object, not MCA, not MVC, not pedestrian accident and not recent injury   Context comment:  Awoke with pain on the left neck on 10/3 Relieved by:  Nothing Worsened by:  Nothing Ineffective treatments:  None tried Associated symptoms: no bladder incontinence, no bowel incontinence, no chest pain, no fever, no leg pain, no numbness, no paresis, no photophobia, no syncope, no tingling, no visual change, no weakness and no weight loss   Risk factors: no hx of head and neck radiation and no recent epidural   Patient with HTN and DM who is off her BP medication has left neck pain.  No CP, no SOB.  No weakness, no numbness, no changes in vision of speech.  BP is elevated.      Past Medical History:  Diagnosis Date   Diabetes mellitus without complication (HCC)    Essential hypertension    Hypertension affecting pregnancy 11/25/2018   [x]  Aspirin 81 mg daily after 12 weeks Current antihypertensives:  Labetalol and Procardia XL   Baseline and surveillance labs (pulled in from [facility], refresh links as needed)  Lab Results Component Value Date  PLT 341 12/08/2018  CREATININE 0.74 12/08/2018  AST 19 12/08/2018  ALT 16 12/08/2018   Antenatal Testing CHTN - O10.919  Group I   BP < 140/90, no meds, no preeclampsia, AGA, nml AFV   Group    Hypothyroidism    Intrauterine fetal death in pregnancy 2019/06/03   IUFD at less than 20 weeks of  gestation Melady Chow 16, 2021   IUGR (intrauterine growth restriction) affecting care of mother 03/18/2019   Morbid obesity (HCC) 07/30/2018   Last Assessment & Plan:  Uncontrolled patient and I discussed lifestyle and dietary measures to lower BMI, will continue to monitor.   Obesity    Pregnancy affected by fetal growth restriction 03/09/2019   440 gms, < 1st% at 25 weeks With absent end diastolic flow   Retinopathy    Severe preeclampsia 2019-06-03   Supervision of high risk pregnancy, antepartum 11/25/2018    Nursing Staff Provider Office Location  [facility] Dating  8 wk 01/25/2019 Language   English Anatomy US  ? Cleft lip and palate, ? 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    Past Surgical History:  Procedure Laterality Date   DILATION AND EVACUATION N/A 05/10/2019   Procedure: DILATATION AND EVACUATION;  Surgeon: Levie Heritage, DO;  Location: [facility] LD ORS;  Service: Gynecology;  Laterality: N/A;   NO PAST SURGERIES       OB History     Gravida  3   Para  1   Term      Preterm   1   AB  2   Living  0      SAB  2   IAB      Ectopic      Multiple  0   Live Births              Family History  Problem Relation Age of Onset   Diabetes Mother    Hypertension Mother    Breast cancer Mother    Diabetes Father    Hypertension Father    Breast cancer Maternal Grandmother     Social History   Tobacco Use   Smoking status: Never   Smokeless tobacco: Never  Vaping Use   Vaping Use: Never used  Substance Use Topics   Alcohol use: No   Drug use: No    Home Medications Prior to Admission medications   Medication Sig Start Date End Date Taking? Authorizing Provider  BD PEN NEEDLE NANO U/F 32G X 4 MM MISC USE WITH INSULIN PEN AS DIRECTED Patient not taking: Reported on 06/09/2019 05/19/19   Hermina Staggers, MD  enalapril (VASOTEC) 20 MG tablet TAKE 1 TABLET BY MOUTH EVERY DAY 08/11/19   Brock Bad, MD  Insulin Glargine (BASAGLAR KWIKPEN) 100 UNIT/ML Inject 0.18 mLs (18 Units total) into the skin daily. 05/26/19   Hermina Staggers, MD  Prenatal Vit-Fe Fumarate-FA (PRENATAL MULTIVITAMIN) TABS tablet Take 1 tablet by mouth at bedtime.    [provider]    Allergies    Monosodium glutamate, Dairy aid [lactase], Other, Soy allergy, and Lac bovis  Review of Systems   Review of Systems  Constitutional:  Negative for fever and weight loss.  HENT:  Negative for congestion and facial swelling.   Eyes:  Negative for photophobia and visual disturbance.  Respiratory:  Negative for shortness of breath, wheezing and stridor.   Cardiovascular:  Negative for chest pain and syncope.  Gastrointestinal:  Negative for bowel incontinence.  Genitourinary:  Negative for bladder incontinence and difficulty urinating.  Musculoskeletal:  Positive for neck pain. Negative for joint swelling and neck stiffness.  Neurological:  Negative for tingling, seizures, facial asymmetry, speech difficulty, weakness, light-headedness and numbness.   Physical  Exam Updated Vital Signs BP (!) 201/127 (BP Location: Left Arm)   Pulse 98   Temp 98.6 F (37 C) (Oral)   Resp 20   Ht 5\' 4"  (1.626 m)   Wt 120.7 kg   LMP 10/26/2020   SpO2 100%   BMI 45.66 kg/m   Physical Exam Vitals and nursing note reviewed.  Constitutional:      General: She is not in acute distress.    Appearance: Normal appearance.  HENT:     Head: Normocephalic and atraumatic.     Nose: Nose normal.  Mouth/Throat:     Mouth: Mucous membranes are moist.  Eyes:     Extraocular Movements: Extraocular movements intact.     Conjunctiva/sclera: Conjunctivae normal.     Pupils: Pupils are equal, round, and reactive to light.  Neck:     Vascular: No carotid bruit.     Comments: Left scm with tenderness, no bruit  Cardiovascular:     Rate and Rhythm: Normal rate and regular rhythm.     Pulses: Normal pulses.     Heart sounds: Normal heart sounds.  Pulmonary:     Effort: Pulmonary effort is normal.     Breath sounds: Normal breath sounds.  Abdominal:     General: Abdomen is flat. Bowel sounds are normal.     Palpations: Abdomen is soft.  Musculoskeletal:        General: Normal range of motion.     Cervical back: Normal range of motion and neck supple. Tenderness present. No rigidity.  Lymphadenopathy:     Cervical: No cervical adenopathy.  Skin:    General: Skin is warm and dry.     Capillary Refill: Capillary refill takes less than 2 seconds.  Neurological:     General: No focal deficit present.     Mental Status: She is alert and oriented to person, place, and time.     Deep Tendon Reflexes: Reflexes normal.  Psychiatric:        Mood and Affect: Mood normal.        Behavior: Behavior normal.    ED Results / Procedures / Treatments   Labs (all labs ordered are listed, but only abnormal results are displayed) Results for orders placed or performed during the hospital encounter of 11/21/20  Resp Panel by RT-PCR (Flu A&B, Covid) Nasopharyngeal Swab    Specimen: Nasopharyngeal Swab; Nasopharyngeal(NP) swabs in vial transport medium  Result Value Ref Range   SARS Coronavirus 2 by RT PCR NEGATIVE NEGATIVE   Influenza A by PCR NEGATIVE NEGATIVE   Influenza B by PCR NEGATIVE NEGATIVE  CBC with Differential/Platelet  Result Value Ref Range   WBC 8.1 4.0 - 10.5 K/uL   RBC 5.66 (H) 3.87 - 5.11 MIL/uL   Hemoglobin 15.4 (H) 12.0 - 15.0 g/dL   HCT 10.6 26.9 - 48.5 %   MCV 79.5 (L) 80.0 - 100.0 fL   MCH 27.2 26.0 - 34.0 pg   MCHC 34.2 30.0 - 36.0 g/dL   RDW 46.2 70.3 - 50.0 %   Platelets 281 150 - 400 K/uL   nRBC 0.0 0.0 - 0.2 %   Neutrophils Relative % 49 %   Neutro Abs 4.1 1.7 - 7.7 K/uL   Lymphocytes Relative 42 %   Lymphs Abs 3.4 0.7 - 4.0 K/uL   Monocytes Relative 6 %   Monocytes Absolute 0.5 0.1 - 1.0 K/uL   Eosinophils Relative 2 %   Eosinophils Absolute 0.2 0.0 - 0.5 K/uL   Basophils Relative 1 %   Basophils Absolute 0.0 0.0 - 0.1 K/uL   Immature Granulocytes 0 %   Abs Immature Granulocytes 0.03 0.00 - 0.07 K/uL  Basic metabolic panel  Result Value Ref Range   Sodium 134 (L) 135 - 145 mmol/L   Potassium 3.9 3.5 - 5.1 mmol/L   Chloride 101 98 - 111 mmol/L   CO2 25 22 - 32 mmol/L   Glucose, Bld 365 (H) 70 - 99 mg/dL   BUN 11 6 - 20 mg/dL   Creatinine, Ser 9.38 0.44 - 1.00 mg/dL  Calcium 9.1 8.9 - 10.3 mg/dL   GFR, Estimated >62 >95 mL/min   Anion gap 8 5 - 15  Pregnancy, urine  Result Value Ref Range   Preg Test, Ur NEGATIVE NEGATIVE  Troponin I (High Sensitivity)  Result Value Ref Range   Troponin I (High Sensitivity) 7 <18 ng/L   CT ANGIO HEAD NECK W WO CM  Result Date: 11/22/2020 CLINICAL DATA:  Initial evaluation for facial trauma.  Headache. EXAM: CT ANGIOGRAPHY HEAD AND NECK TECHNIQUE: Multidetector CT imaging of the head and neck was performed using the standard protocol during bolus administration of intravenous contrast. Multiplanar CT image reconstructions and MIPs were obtained to evaluate the vascular  anatomy. Carotid stenosis measurements (when applicable) are obtained utilizing NASCET criteria, using the distal internal carotid diameter as the denominator. CONTRAST:  43mL OMNIPAQUE IOHEXOL 350 MG/ML SOLN COMPARISON:  None available. FINDINGS: CT HEAD FINDINGS Brain: Cerebral volume within normal limits for patient age. No evidence for acute intracranial hemorrhage. No findings to suggest acute large vessel territory infarct. No mass lesion, midline shift, or mass effect. Ventricles are normal in size without evidence for hydrocephalus. No extra-axial fluid collection identified. Vascular: No hyperdense vessel identified. Skull: Scalp soft tissues demonstrate no acute abnormality. Calvarium intact. Sinuses/Orbits: Globes and orbital soft tissues within normal limits. Scattered mucosal thickening within the right greater than left maxillary sinuses. Paranasal sinuses are otherwise clear. No mastoid effusion. CTA NECK FINDINGS Aortic arch: Examination technically limited by timing the contrast bolus and habitus. Visualized aortic arch normal in caliber with normal branch pattern. No stenosis or other abnormality about the origin the great vessels. Right carotid system: Right common and internal carotid arteries widely patent without stenosis, dissection or occlusion. Left carotid system: Left common and internal carotid arteries widely patent without stenosis, dissection or occlusion. Vertebral arteries: Both vertebral arteries arise from subclavian arteries, with the right being slightly dominant. No visible proximal subclavian artery stenosis. Vertebral arteries patent without stenosis, dissection or occlusion. Skeleton: No discrete or worrisome osseous lesions. No visible acute osseous finding. Other neck: 1.8 cm right thyroid nodule (series 10, image 24). No other visible acute soft tissue abnormality. Upper chest: Visualized upper chest demonstrates no acute finding. Review of the MIP images confirms the  above findings CTA HEAD FINDINGS Anterior circulation: Evaluation of the intracranial circulation limited by timing the contrast bolus. Both internal carotid arteries patent to the termini without visible stenosis or other abnormality. A1 segments patent. Grossly normal anterior communicating artery complex. Anterior cerebral arteries grossly patent to their distal aspects without stenosis. No M1 stenosis or occlusion. Normal MCA bifurcations. Distal MCA branches well perfused and symmetric. Posterior circulation: V4 segments patent to the vertebrobasilar junction without stenosis. Both PICA origins patent and normal. Basilar diminutive but patent to its distal aspect without stenosis. Superior cerebellar arteries are grossly patent at their origins. Predominant fetal type origin of the PCAs. PCAs grossly patent to their distal aspects without stenosis. Venous sinuses: Patent allowing for timing the contrast bolus. Anatomic variants: Predominant fetal type origin of the PCAs with overall diminutive vertebrobasilar system. Review of the MIP images confirms the above findings IMPRESSION: 1. Negative CTA of the head and neck. No large vessel occlusion or other acute vascular abnormality. No hemodynamically significant or correctable stenosis. 2. No other acute intracranial abnormality identified. 3. 1.8 cm right thyroid nodule. Further evaluation with dedicated thyroid ultrasound recommended for further evaluation. This could be performed on a nonemergent outpatient basis. (Ref: J Am Coll Radiol. 2015  Feb;12(2): 143-50). Electronically Signed   By: Rise Mu M.D.   On: 11/22/2020 01:59    EKG None  Radiology No results found.  Procedures Procedures   Medications Ordered in ED Medications - No data to display  ED Course  I have reviewed the triage vital signs and the nursing notes.  Pertinent labs & imaging results that were available during my care of the patient were reviewed by me and  considered in my medical decision making (see chart for details).   Pain in the neck is MSK in nature.  Informed of thyroid nodule and need to follow up.  Will start BP medication and metformin.  I have informed the patient she must follow up with PMD this week for adjustments to medication.  Patient verbalizes understanding and agrees to follow up.  Ruled out for Mi and dissection of neck vessels in the ED>     Brenda Casey was evaluated in Emergency Department on 11/22/2020 for the symptoms described in the history of present illness. She was evaluated in the context of the global COVID-19 pandemic, which necessitated consideration that the patient might be at risk for infection with the SARS-CoV-2 virus that causes COVID-19. Institutional protocols and algorithms that pertain to the evaluation of patients at risk for COVID-19 are in a state of rapid change based on information released by regulatory bodies including the CDC and federal and state organizations. These policies and algorithms were followed during the patient's care in the ED.  Final Clinical Impression(s) / ED Diagnoses Final diagnoses:  None  Return for intractable cough, coughing up blood, fevers > 100.4 unrelieved by medication, shortness of breath, intractable vomiting, chest pain, shortness of breath, weakness, numbness, changes in speech, facial asymmetry, abdominal pain, passing out, Inability to tolerate liquids or food, cough, altered mental status or any concerns. No signs of systemic illness or infection. The patient is nontoxic-appearing on exam and vital signs are within normal limits.  I have reviewed the triage vital signs and the nursing notes. Pertinent labs & imaging results that were available during my care of the patient were reviewed by me and considered in my medical decision making (see chart for details). After history, exam, and medical workup I feel the patient has been appropriately medically screened and is  safe for discharge home. Pertinent diagnoses were discussed with the patient. Patient was given return precautions.   Rx / DC Orders ED Discharge Orders     None        Vedanth Sirico, MD 11/22/20 0225

## 2020-11-21 NOTE — ED Triage Notes (Addendum)
Pt states she woke 10/3 and with pain to left side of neck-denies injury-states pain is worse with movement-HA started later in the day 10/3-NAD-steady gait-when questioned about hx HTN-states her BP was WNL in Jan 2022 with PCP/[facility] was taken off BP meds- states she took BP meds x ~1.5 years

## 2020-11-22 ENCOUNTER — Encounter (HOSPITAL_BASED_OUTPATIENT_CLINIC_OR_DEPARTMENT_OTHER): Payer: Self-pay | Admitting: Emergency Medicine

## 2020-11-22 DIAGNOSIS — M542 Cervicalgia: Secondary | ICD-10-CM | POA: Diagnosis not present

## 2020-11-22 LAB — RESP PANEL BY RT-PCR (FLU A&B, COVID) ARPGX2
Influenza A by PCR: NEGATIVE
Influenza B by PCR: NEGATIVE
SARS Coronavirus 2 by RT PCR: NEGATIVE

## 2020-11-22 LAB — TROPONIN I (HIGH SENSITIVITY)
Troponin I (High Sensitivity): 7 ng/L (ref <18)
Troponin I (High Sensitivity): 8 ng/L (ref <18)

## 2020-11-22 MED ORDER — KETOROLAC TROMETHAMINE 30 MG/ML IJ SOLN
30.0000 mg | Freq: Once | INTRAMUSCULAR | Status: AC
  Administered 2020-11-22: 30 mg via INTRAVENOUS
  Filled 2020-11-22: qty 1

## 2020-11-22 MED ORDER — HYDROCHLOROTHIAZIDE 25 MG PO TABS
50.0000 mg | ORAL_TABLET | Freq: Once | ORAL | Status: AC
  Administered 2020-11-22: 50 mg via ORAL
  Filled 2020-11-22: qty 2

## 2020-11-22 MED ORDER — DICLOFENAC SODIUM ER 100 MG PO TB24: 100.0000 mg | ORAL_TABLET | Freq: Every day | ORAL | 0 refills | Status: AC

## 2020-11-22 MED ORDER — METFORMIN HCL 500 MG PO TABS: 500.0000 mg | ORAL_TABLET | Freq: Two times a day (BID) | ORAL | 0 refills | Status: AC

## 2020-11-22 MED ORDER — LIDOCAINE 5 % EX PTCH: 1.0000 | MEDICATED_PATCH | CUTANEOUS | 0 refills | Status: AC

## 2020-11-22 MED ORDER — AMLODIPINE BESYLATE 10 MG PO TABS: 10.0000 mg | ORAL_TABLET | Freq: Every day | ORAL | 0 refills | Status: AC

## 2020-11-22 MED ORDER — METFORMIN HCL 500 MG PO TABS
1000.0000 mg | ORAL_TABLET | Freq: Once | ORAL | Status: AC
  Administered 2020-11-22: 1000 mg via ORAL
  Filled 2020-11-22: qty 2

## 2020-12-25 ENCOUNTER — Encounter (HOSPITAL_COMMUNITY): Payer: Self-pay | Admitting: Radiology

## 2021-03-05 ENCOUNTER — Other Ambulatory Visit: Payer: Self-pay

## 2021-03-05 ENCOUNTER — Emergency Department (HOSPITAL_BASED_OUTPATIENT_CLINIC_OR_DEPARTMENT_OTHER)
Admission: EM | Admit: 2021-03-05 | Discharge: 2021-03-06 | Disposition: A | Discharge status: Home / Self Care | Payer: 59 | Attending: Emergency Medicine | Admitting: Emergency Medicine

## 2021-03-05 ENCOUNTER — Encounter (HOSPITAL_BASED_OUTPATIENT_CLINIC_OR_DEPARTMENT_OTHER): Payer: Self-pay

## 2021-03-05 DIAGNOSIS — J029 Acute pharyngitis, unspecified: Secondary | ICD-10-CM | POA: Diagnosis present

## 2021-03-05 DIAGNOSIS — U071 COVID-19: Secondary | ICD-10-CM | POA: Diagnosis not present

## 2021-03-05 DIAGNOSIS — R590 Localized enlarged lymph nodes: Secondary | ICD-10-CM | POA: Diagnosis not present

## 2021-03-05 LAB — GROUP A STREP BY PCR: Group A Strep by PCR: NOT DETECTED

## 2021-03-05 MED ORDER — IBUPROFEN 800 MG PO TABS
800.0000 mg | ORAL_TABLET | Freq: Once | ORAL | Status: AC
  Administered 2021-03-05: 800 mg via ORAL
  Filled 2021-03-05: qty 1

## 2021-03-05 NOTE — ED Triage Notes (Signed)
Pt c/o sore throat.fever started yesterday-NAD-steady gait

## 2021-03-06 LAB — RESP PANEL BY RT-PCR (FLU A&B, COVID) ARPGX2
Influenza A by PCR: NEGATIVE
Influenza B by PCR: NEGATIVE
SARS Coronavirus 2 by RT PCR: POSITIVE — AB

## 2021-03-06 NOTE — ED Notes (Signed)
D/c paperwork reviewed with pt.  Pt provided with work note, per her request. No further questions or comments at time of d/c. Pt ambulatory to ED exit on RA, NAD.

## 2021-03-06 NOTE — ED Provider Notes (Signed)
[facility] EMERGENCY DEPARTMENT Provider Note   CSN: [id number] Arrival date & time: 03/05/21  2127     History  Chief Complaint  Patient presents with   Sore Throat    Kayte Borchard is a 36 y.o. female who presents to the ED for evaluation of sore throat that started yesterday and has been gradually worsening.  She has not been around anyone has been sick.  She does have a history of strep throat and feels that her symptoms are similar to that.  She denies cough, congestion, headache.  She does endorse a fever that started yesterday as high as 101 F at home.  No alleviating or aggravating factors no treatment prior to arrival.    Sore Throat Pertinent negatives include no abdominal pain, no headaches and no shortness of breath.      Home Medications Prior to Admission medications   Medication Sig Start Date End Date Taking? Authorizing Provider  amLODipine (NORVASC) 10 MG tablet Take 1 tablet (10 mg total) by mouth daily. 11/22/20   Palumbo, April, MD  BD PEN NEEDLE NANO U/F 32G X 4 MM MISC USE WITH INSULIN PEN AS DIRECTED Patient not taking: Reported on 06/09/2019 05/19/19   Hermina Staggers, MD  Diclofenac Sodium CR 100 MG 24 hr tablet Take 1 tablet (100 mg total) by mouth daily. 11/22/20   Palumbo, April, MD  enalapril (VASOTEC) 20 MG tablet TAKE 1 TABLET BY MOUTH EVERY DAY 08/11/19   Brock Bad, MD  Insulin Glargine (BASAGLAR KWIKPEN) 100 UNIT/ML Inject 0.18 mLs (18 Units total) into the skin daily. 05/26/19   Hermina Staggers, MD  lidocaine (LIDODERM) 5 % Place 1 patch onto the skin daily. Remove & Discard patch within 12 hours or as directed by MD 11/22/20   Nicanor Alcon, April, MD  metFORMIN (GLUCOPHAGE) 500 MG tablet Take 1 tablet (500 mg total) by mouth 2 (two) times daily with a meal. 11/22/20   Palumbo, April, MD  Prenatal Vit-Fe Fumarate-FA (PRENATAL MULTIVITAMIN) TABS tablet Take 1 tablet by mouth at bedtime.    [provider]      Allergies     Monosodium glutamate, Dairy aid [tilactase], Other, Soy allergy, and Lac bovis    Review of Systems   Review of Systems  Constitutional:  Positive for fever.  HENT:  Positive for sore throat.   Eyes: Negative.   Respiratory:  Negative for shortness of breath.   Cardiovascular: Negative.   Gastrointestinal:  Negative for abdominal pain and vomiting.  Endocrine: Negative.   Genitourinary: Negative.   Musculoskeletal: Negative.   Skin:  Negative for rash.  Neurological:  Negative for headaches.  All other systems reviewed and are negative.  Physical Exam Updated Vital Signs BP (!) 186/115    Pulse 98    Temp 99.4 F (37.4 C) (Oral)    Resp 19    Ht 5\' 4"  (1.626 m)    Wt 117.9 kg    LMP 02/09/2021    SpO2 97%    BMI 44.63 kg/m  Physical Exam Vitals and nursing note reviewed.  Constitutional:      General: She is not in acute distress.    Appearance: She is ill-appearing.  HENT:     Head: Atraumatic.     Mouth/Throat:     Pharynx: Uvula midline. Pharyngeal swelling and posterior oropharyngeal erythema present.  Eyes:     Conjunctiva/sclera: Conjunctivae normal.  Cardiovascular:     Rate and Rhythm: Normal rate and regular  rhythm.     Pulses: Normal pulses.     Heart sounds: No murmur heard. Pulmonary:     Effort: Pulmonary effort is normal. No respiratory distress.     Breath sounds: Normal breath sounds.  Abdominal:     General: Abdomen is flat. There is no distension.     Palpations: Abdomen is soft.     Tenderness: There is no abdominal tenderness.  Musculoskeletal:        General: Normal range of motion.     Cervical back: Normal range of motion.  Lymphadenopathy:     Cervical: Cervical adenopathy present.  Skin:    General: Skin is warm and dry.     Capillary Refill: Capillary refill takes less than 2 seconds.  Neurological:     General: No focal deficit present.     Mental Status: She is alert.  Psychiatric:        Mood and Affect: Mood normal.    ED  Results / Procedures / Treatments   Labs (all labs ordered are listed, but only abnormal results are displayed) Labs Reviewed  RESP PANEL BY RT-PCR (FLU A&B, COVID) ARPGX2 - Abnormal; Notable for the following components:      Result Value   SARS Coronavirus 2 by RT PCR POSITIVE (*)    All other components within normal limits  GROUP A STREP BY PCR    EKG None  Radiology No results found.  Procedures Procedures    Medications Ordered in ED Medications  ibuprofen (ADVIL) tablet 800 mg (800 mg Oral Given 03/05/21 2248)    ED Course/ Medical Decision Making/ A&P                           Medical Decision Making Risk Prescription drug management.   History:  Cheral BayJaleesa Rosko is a 36 y.o. female who presents to the ED for evaluation of sore throat that started yesterday and has been gradually worsening.  She has not been around anyone has been sick.  She does have a history of strep throat and feels that her symptoms are similar to that.  She denies cough, congestion, headache.  She does endorse a fever that started yesterday as high as 101 F at home.  No alleviating or aggravating factors no treatment prior to arrival.  This patient presents to the ED for concern of sore throat, this involves an extensive number of treatment options, and is a complaint that carries with it a high risk of complications and morbidity.   Differentials include strep throat, mononucleosis, viral URI, epiglottitis, otitis media  Initial impression:  Patient's physical exam was overall reassuring, however her throat was quite erythematous with very mild bilateral tonsillar swelling.  Her vitals show low-grade fever of 100.4, patient was slightly tachycardic initial presentation at 106.  Her blood pressure was quite high here in the ED, patient states that she has been compliant on her blood pressure medications.  Patient has history of strep throat and thinks that this feels similar.  We will proceed  with strep test and respiratory panel.  Lab Tests and EKG:  I Ordered, reviewed, and interpreted labs and EKG.  The pertinent results include negative strep test, COVID positive.   Medicines ordered and prescription drug management:  I ordered medication including: Motrin 800 mg for fever and pain  Reevaluation of the patient after these medicines showed that the patient stayed the same I have reviewed the patients home medicines  and have made adjustments as needed    Disposition:  After consideration of the diagnostic results, physical exam, history and the patients response to treatment feel that the patent would benefit from discharge with outpatient follow-up.   COVID 19: Lab results were discussed with patient.  Supportive at home measures were discussed.  Her pulmonary exam was unremarkable and she has no pulmonary complaints.  However I did advise should these progress and she develops difficulty breathing she should return to the ED.  All questions were asked and answered.  Patient discharged home in good condition.  Patient is to monitor her blood pressure and follow-up with PCP to reevaluate medications.   Final Clinical Impression(s) / ED Diagnoses Final diagnoses:  COVID-19    Rx / DC Orders ED Discharge Orders     None         Janell Quiet, PA-C 03/06/21 0052    Benjiman Core, MD 03/08/21 [phone]

## 2021-03-06 NOTE — Discharge Instructions (Addendum)
You have tested positive for COVID-19 virus.  Please continue to quarantine at home and monitor your symptoms closely. Antibiotics are not helpful in treating viral infection, the virus should run its course in about 5-7 days. Please make sure you are drinking plenty of fluids. You can treat your symptoms supportively with tylenol for fevers and pains, and over the counter cough syrups and throat lozenges to help with cough and sore throat. If your symptoms are not improving please follow up with you Primary doctor.   If you develop persistent fevers, shortness of breath or difficulty breathing, chest pain, severe headache and neck pain, persistent nausea and vomiting or other new or concerning symptoms return to the Emergency department.

## 2021-04-15 ENCOUNTER — Other Ambulatory Visit: Payer: Self-pay

## 2021-04-15 DIAGNOSIS — Z8639 Personal history of other endocrine, nutritional and metabolic disease: Secondary | ICD-10-CM

## 2021-04-16 ENCOUNTER — Ambulatory Visit
Admission: RE | Admit: 2021-04-16 | Discharge: 2021-04-16 | Disposition: A | Discharge status: Home / Self Care | Payer: Self-pay | Source: Ambulatory Visit

## 2021-04-16 ENCOUNTER — Other Ambulatory Visit: Payer: Self-pay

## 2021-04-16 DIAGNOSIS — Z8639 Personal history of other endocrine, nutritional and metabolic disease: Secondary | ICD-10-CM

## 2021-04-17 IMAGING — US US MFM UA CORD DOPPLER
1 series · 14 of 23 positions shown · non-contrast
Comparison: none

[Series 1: us mfm ua cord doppler · 14 of 23 slices shown]
[im 1/23]
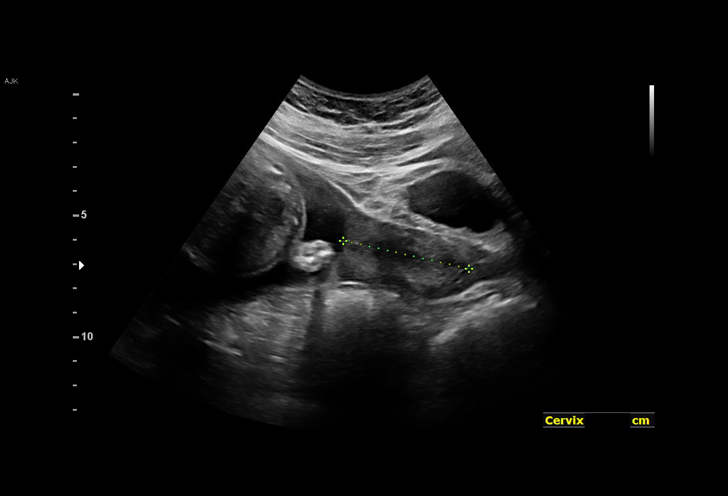
[im 3/23]
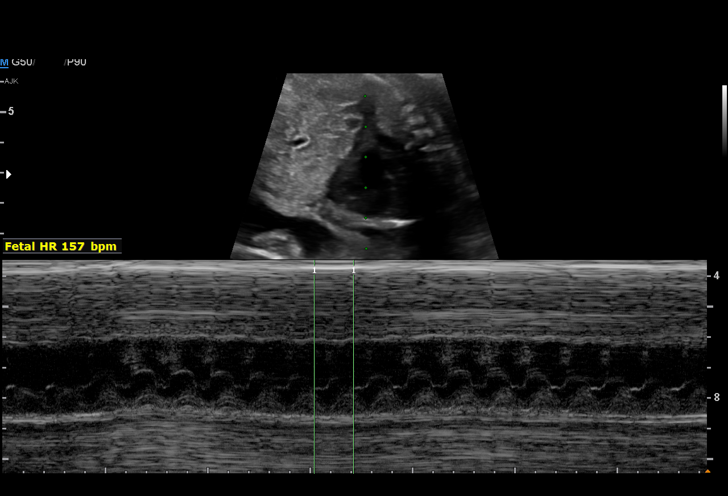
[im 5/23]
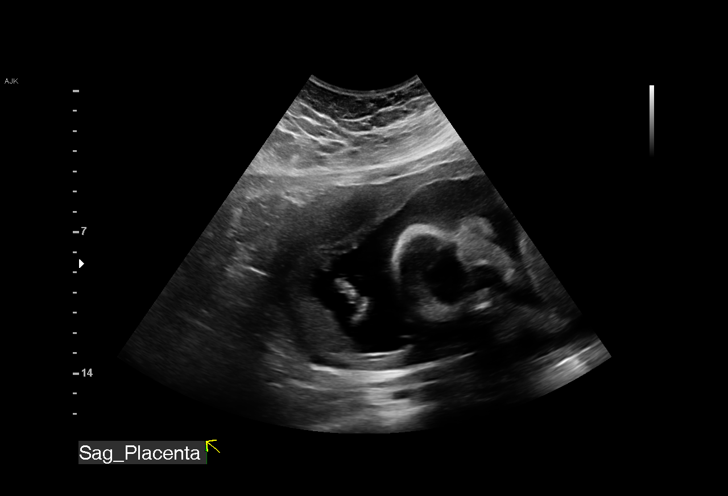
[im 6/23]
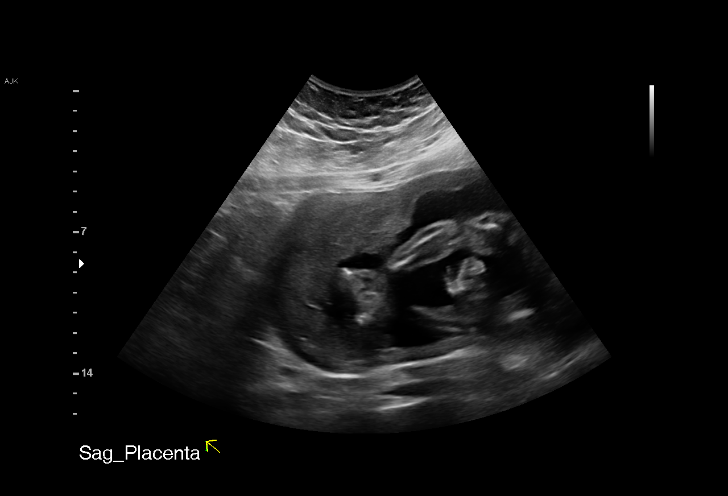
[im 8/23]
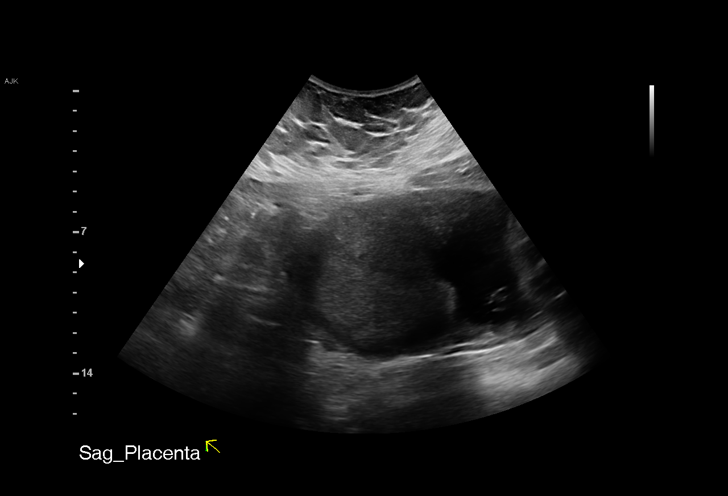
[im 10/23]
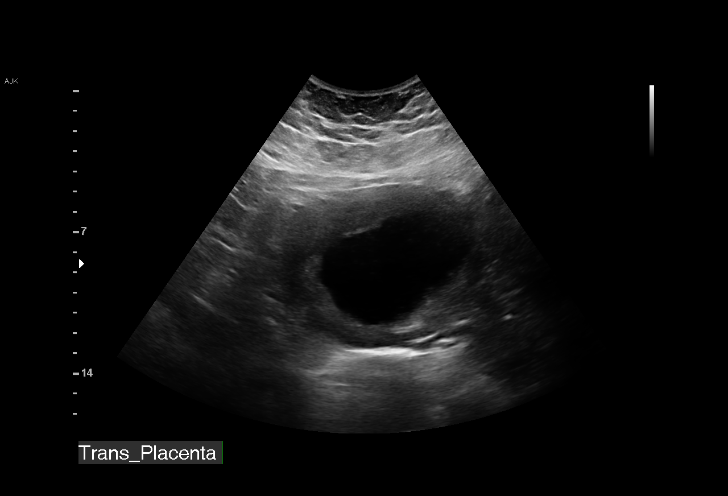
[im 11/23]
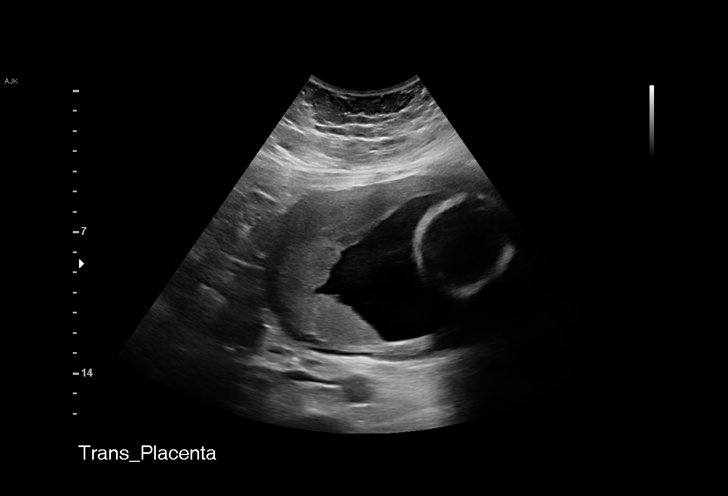
[im 13/23]
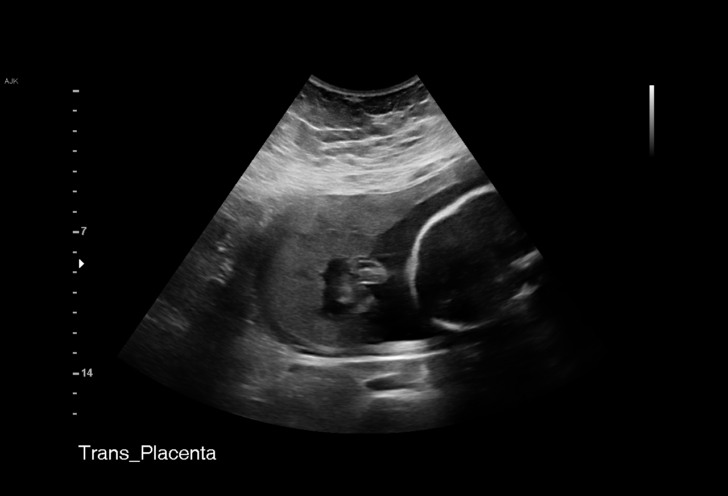
[im 14/23]
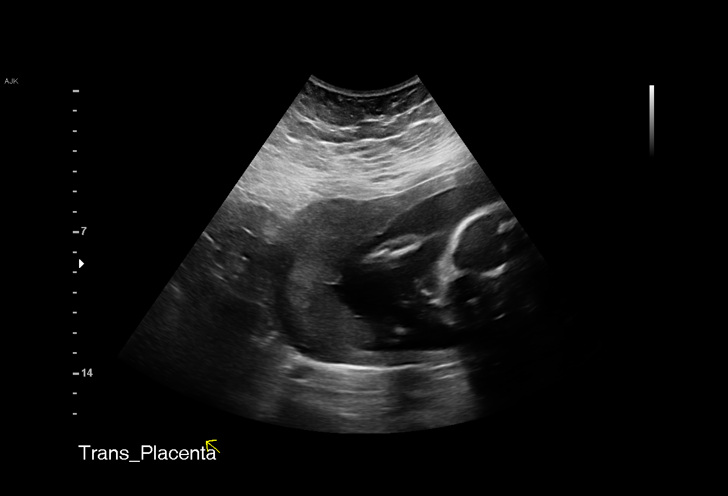
[im 16/23]
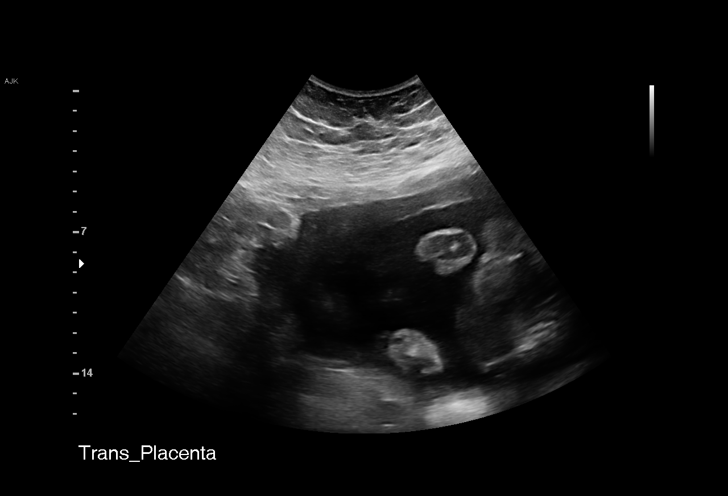
[im 18/23]
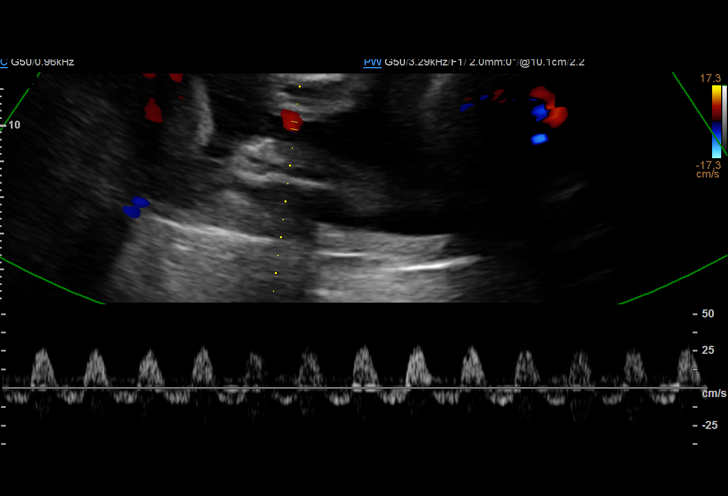
[im 19/23]
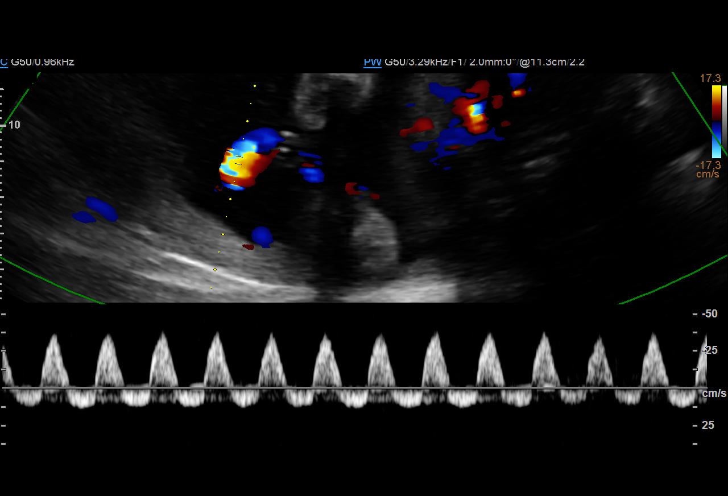
[im 21/23]
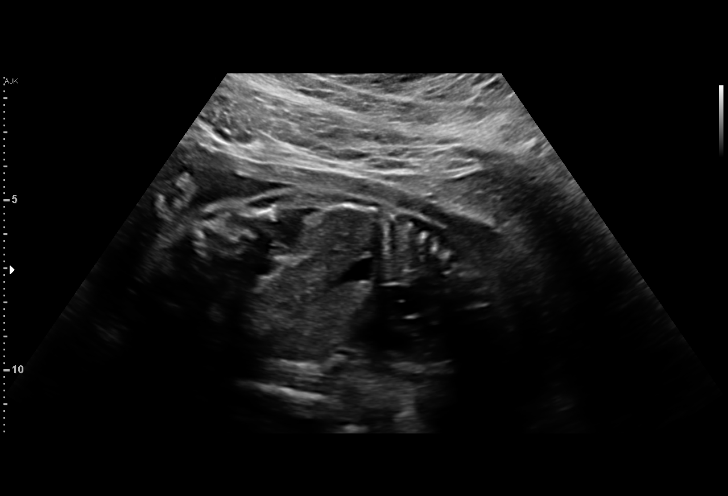
[im 23/23]
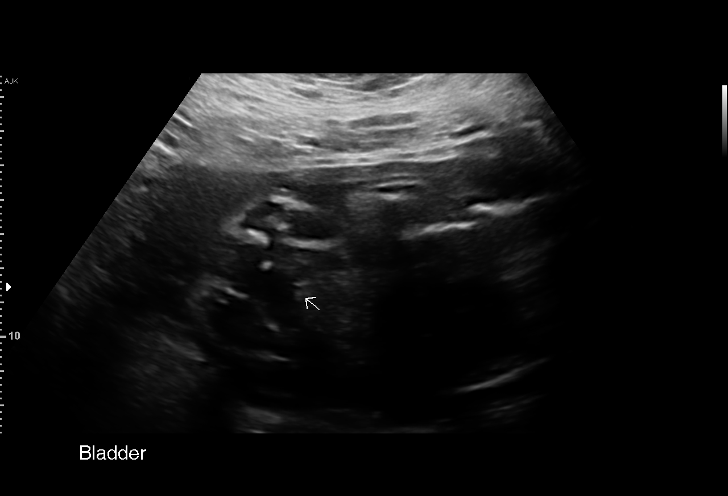

[14 of 23 positions shown; findings below may reference images not displayed]

[address]

  1  US MFM UA CORD DOPPLER               76820.02     HAWA PE
 ----------------------------------------------------------------------

 ----------------------------------------------------------------------
Indications

  26 weeks gestation of pregnancy
  Hypertension - Chronic/Pre-existing
  (labetalol)
  Pre-existing diabetes, type 2, in pregnancy,
  second trimester (insulin)
  Maternal morbid obesity
  Hypothyroid
  Fetal abnormality - other known or
  suspected (cleft lip, 2vc)
  Maternal care for known or suspected poor
  fetal growth, second trimester, not applicable
  or unspecified IUGR
 ----------------------------------------------------------------------
Fetal Evaluation

 Num Of Fetuses:         1
 Fetal Heart Rate(bpm):  157
 Cardiac Activity:       Observed
 Presentation:           Transverse, head to maternal left
 Placenta:               Fundal
 P. Cord Insertion:      Previously Visualized

 Amniotic Fluid
 AFI FV:      Within normal limits

                             Largest Pocket(cm)

OB History

 Gravidity:    3         Term:   0        Prem:   0        SAB:   2
 TOP:          0       Ectopic:  0        Living: 0
Gestational Age

 LMP:           28w 6d        Date:  08/31/18                 EDD:   06/07/19
 Best:          26w 6d     Det. By:  Early Ultrasound         EDD:   06/21/19
                                     (11/12/18)
Doppler - Fetal Vessels

 Umbilical Artery
                                                                   RDFV
                                                                     Yes

Cervix Uterus Adnexa

 Cervix
 Length:            5.3  cm.
 Normal appearance by transabdominal scan.

 Uterus
 No abnormality visualized.

 Left Ovary
 Not visualized.

 Right Ovary
 Not visualized.

 Adnexa
 No abnormality visualized.
Impression

 Patient with severe fetal growth restriction was admitted 3
 days ago for control of blood pressure and diabetes.  She
 received a course of antenatal corticosteroids
 On ultrasound, amniotic fluid is normal good fetal activity
 seen.  Transverse lie and head to maternal left.  Umbilical
 artery Doppler showed persistent reversed end-diastolic flow.
Recommendations

 -Neonatal consultation.
 -Umbilical artery Doppler studies on
 [REDACTED]/[REDACTED]/[REDACTED].
 -Inpatient management to continue.
                 Jumper, Klever

## 2021-04-30 ENCOUNTER — Other Ambulatory Visit: Payer: Self-pay | Admitting: Radiology

## 2021-04-30 ENCOUNTER — Other Ambulatory Visit (HOSPITAL_COMMUNITY)
Admission: RE | Admit: 2021-04-30 | Discharge: 2021-04-30 | Disposition: A | Discharge status: Home / Self Care | Payer: 59 | Source: Ambulatory Visit | Attending: Interventional Radiology | Admitting: Interventional Radiology

## 2021-04-30 ENCOUNTER — Ambulatory Visit
Admission: RE | Admit: 2021-04-30 | Discharge: 2021-04-30 | Disposition: A | Discharge status: Home / Self Care | Payer: 59 | Source: Ambulatory Visit

## 2021-04-30 DIAGNOSIS — Z8639 Personal history of other endocrine, nutritional and metabolic disease: Secondary | ICD-10-CM | POA: Diagnosis present

## 2021-05-01 LAB — CYTOLOGY - NON PAP

## 2021-06-05 IMAGING — US US MFM OB LIMITED
2 series · 11 of 11 positions shown · non-contrast
Comparison: none

[address]
 Attending:        Polin Billiot        Secondary Phy.:   [facility] MAU/Triage

  1  US MFM OB LIMITED                    76815.01     SHALLEY JIM
 ----------------------------------------------------------------------
Indications
  33 weeks gestation of pregnancy
  Pregnancy with inconclusive fetal viability
  Fetal demise greater than 22 weeks
Fetal Evaluation
 Num Of Fetuses:         1
 Cardiac Activity:       Absent
 Presentation:           Cephalic
                             Largest Pocket(cm)
OB History
 Gravidity:    3         Term:   0        Prem:   0        SAB:   2
 TOP:          0       Ectopic:  0        Living: 0
Gestational Age
 LMP:           35w 6d        Date:  08/31/18                 EDD:   06/07/19
 Best:          33w 6d     Det. By:  Early Ultrasound         EDD:   06/21/19
                                     (11/12/18)
Impression
 Patient presented to the [facility] with complaints of loss of fetal
 movements.  Her last visit the [HOSPITAL]
 was at 27 weeks gestation.  She had severe range blood
 pressure and reversed end-diastolic flow on umbilical artery
 Doppler studies.  We had recommended admission.
 A limited ultrasound study was performed.  Fetal scalp shows
 separation of skin from the underlying bowel suggesting fetal
 maceration.  Unfortunately, no fetal heart activity was seen.
 Cephalic presentation.

[Series 4: us mfm ob limited · 1 of 1 slices shown (1 of 2)]
[im 1/1]
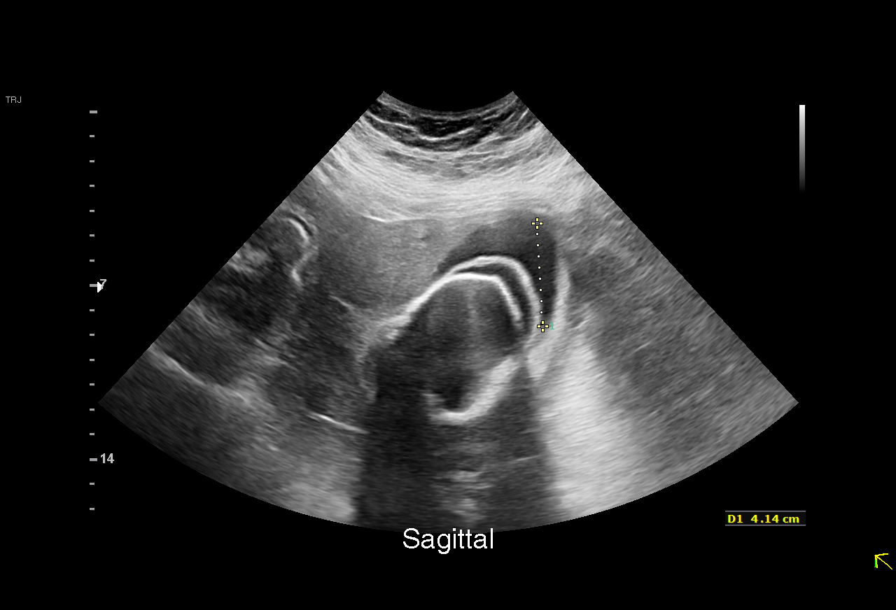

[Series 5: us mfm ob limited · 10 acquisitions, 10 frames shown (2 of 2)]
[im 1/10]
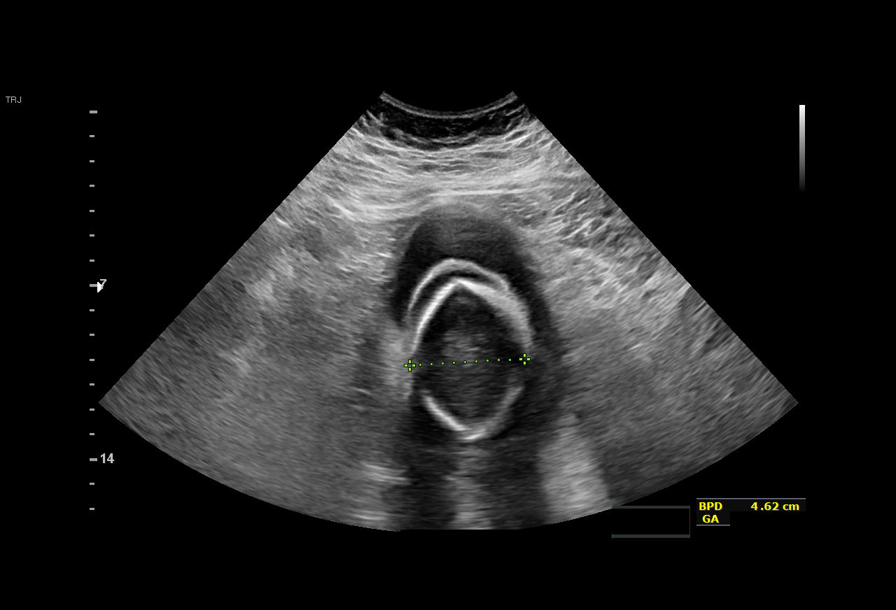
[im 2/10]
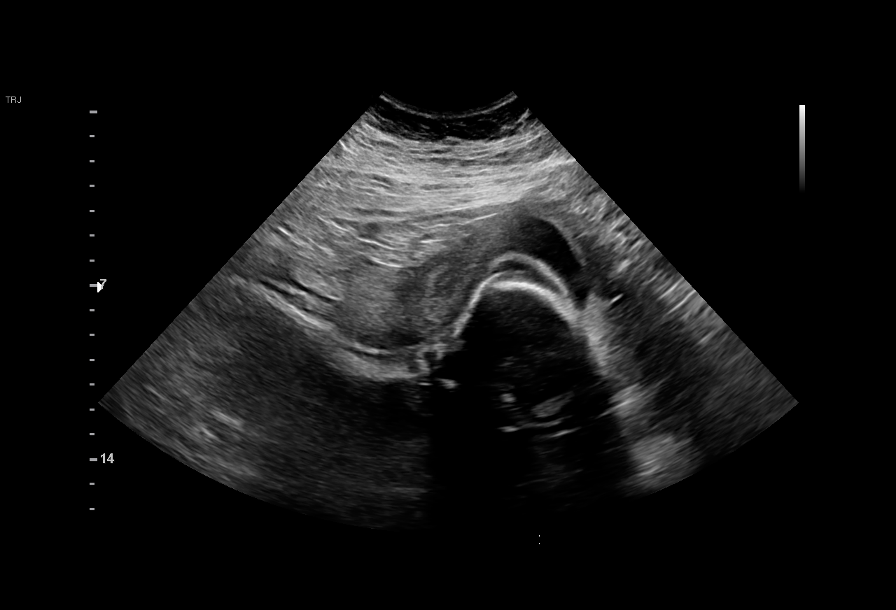
[im 3/10]
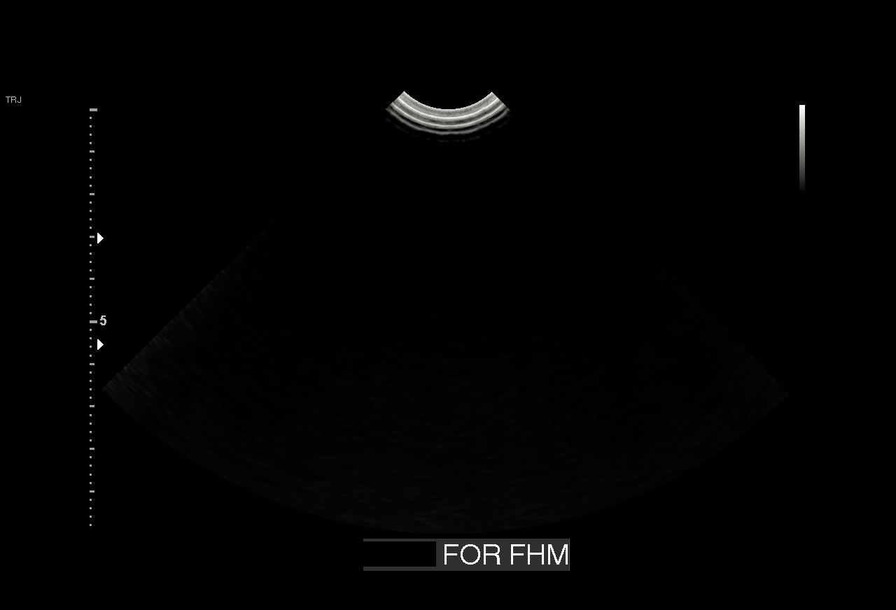
[im 4/10]
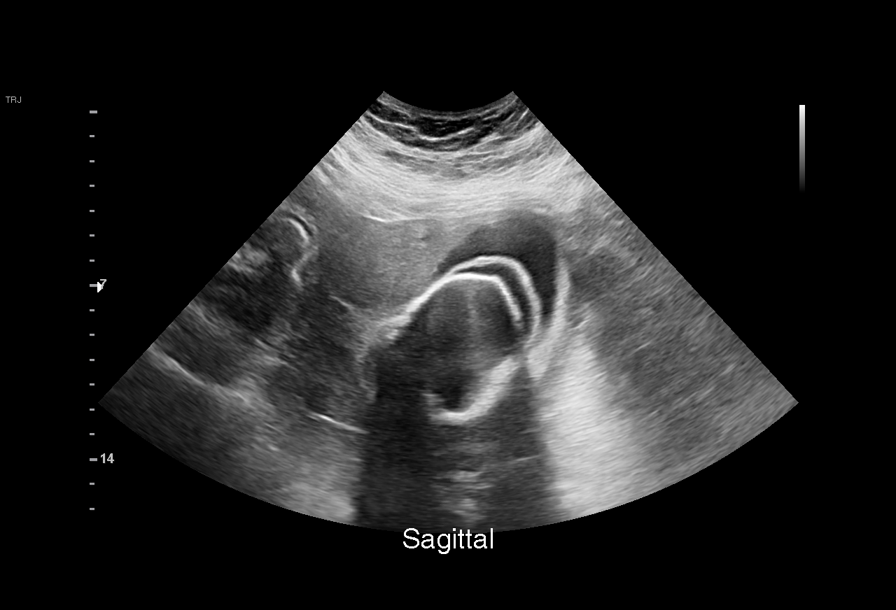
[im 5/10]
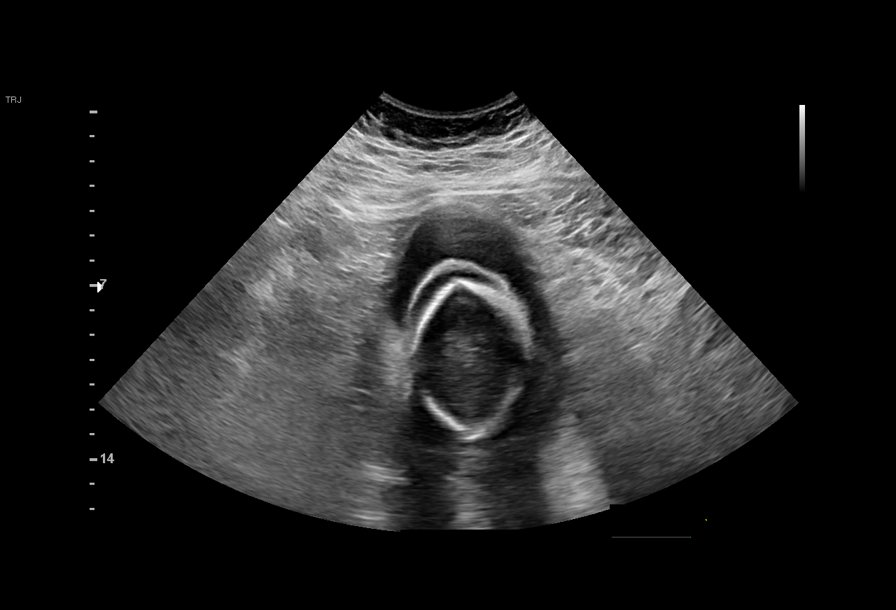
[im 6/10]
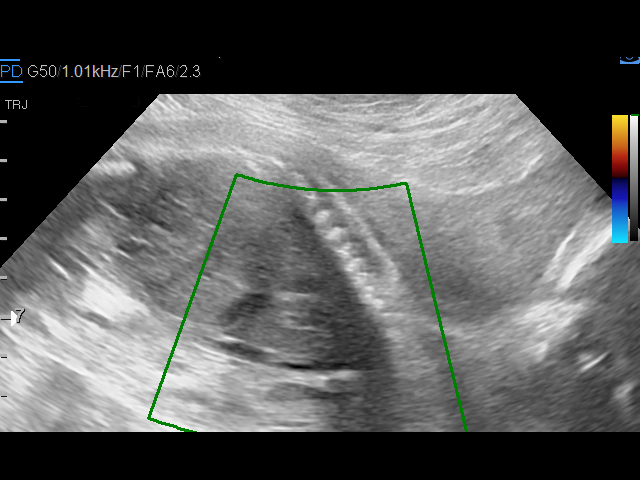
[im 7/10]
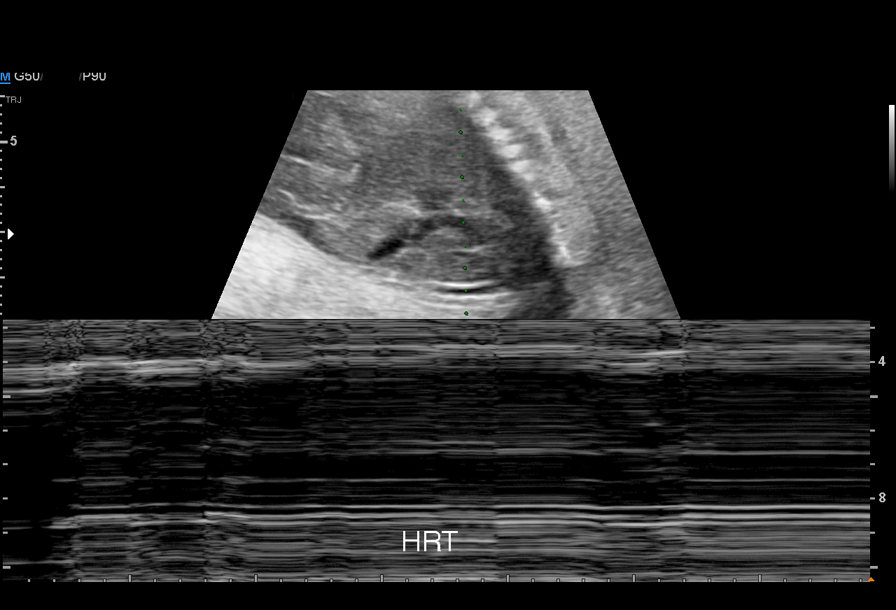
[im 8/10]
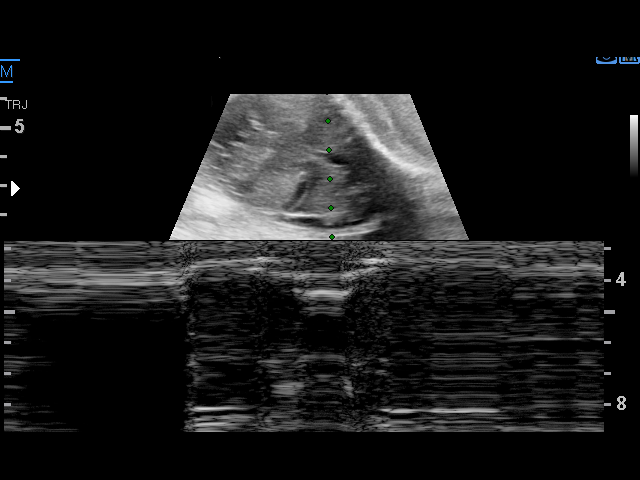
[im 9/10]
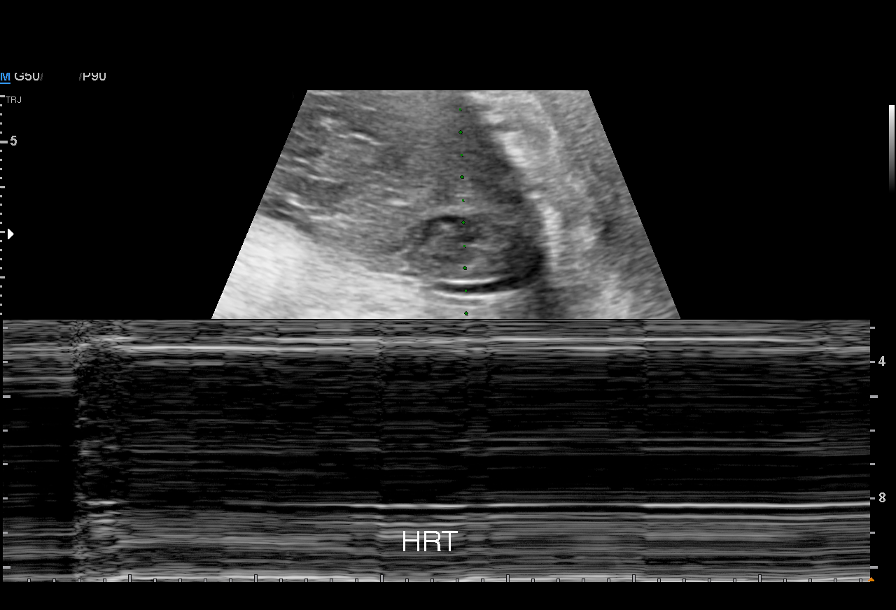
[im 10/10]
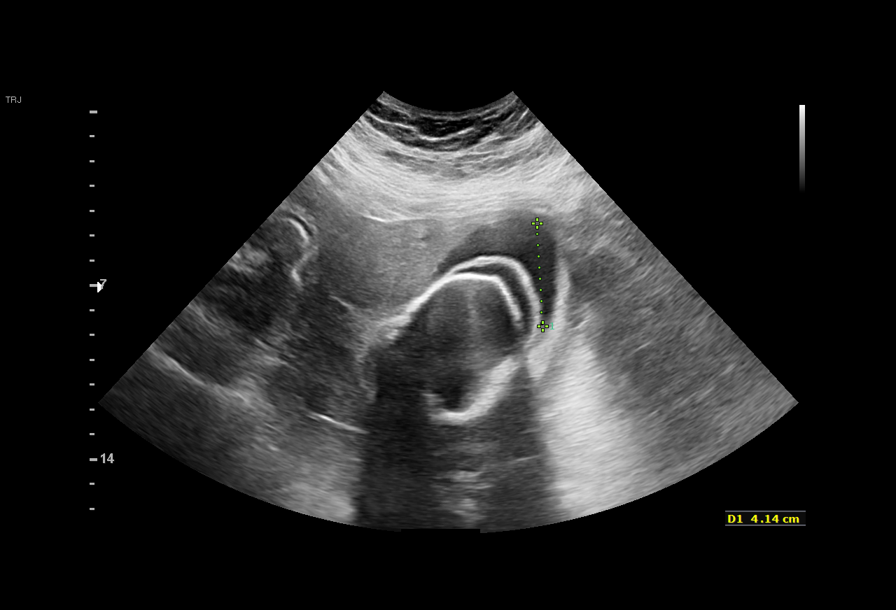

[11 of 11 positions shown; findings below may reference images not displayed]

IMPRESSION: Intrauterine fetal demise.
Recommendations

 Patient was admitted for further management.
                 Sarkisian, Yanette

## 2022-03-20 ENCOUNTER — Other Ambulatory Visit: Payer: Self-pay

## 2022-03-20 DIAGNOSIS — Z8639 Personal history of other endocrine, nutritional and metabolic disease: Secondary | ICD-10-CM

## 2022-04-02 ENCOUNTER — Ambulatory Visit
Admission: RE | Admit: 2022-04-02 | Discharge: 2022-04-02 | Disposition: A | Discharge status: Home / Self Care | Payer: 59 | Source: Ambulatory Visit

## 2022-04-02 DIAGNOSIS — Z8639 Personal history of other endocrine, nutritional and metabolic disease: Secondary | ICD-10-CM

## 2022-10-31 NOTE — Progress Notes (Unsigned)
Cardiology Office Note:  .   Date:  11/03/2022  ID:  Brenda Casey, DOB 1985/12/26, MRN [id number] PCP: Patient, No Pcp Per  [facility] Providers Cardiologist:  None { Click to update primary MD,subspecialty MD or APP then REFRESH:1}   History of Present Illness: .   Brenda Casey is a 37 y.o. female DM2, obesity, HTN referral from [facility] for HTN. BP is 132/98 mmHg. She is on telmisartan 80-norvasc 10 mg daily. BP 132/98 mmHg. Her parents have hypertension.  At home Bps are around this range  ROS:  per HPI otherwise negative  Studies Reviewed: Marland Kitchen   EKG Interpretation Date/Time:  Monday November 03 2022 11:32:25 EDT Ventricular Rate:  111 PR Interval:  120 QRS Duration:  72 QT Interval:  336 QTC Calculation: 456 R Axis:   91  Text Interpretation: Sinus tachycardia Rightward axis T wave abnormality, consider inferior ischemia When compared with ECG of 22-Nov-2020 02:32, PREVIOUS ECG IS PRESENT Confirmed by Carolan Clines ([id number]) on 11/03/2022 11:36:19 AM      Risk Assessment/Calculations:    Physical Exam:   VS:  BP (!) 132/98 (BP Location: Left Arm, Patient Position: Sitting, Cuff Size: Normal)   Pulse (!) 116   Ht 5\' 4"  (1.626 m)   Wt 256 lb 6.4 oz (116.3 kg)   SpO2 98%   BMI 44.01 kg/m    Wt Readings from Last 3 Encounters:  11/03/22 256 lb 6.4 oz (116.3 kg)  03/05/21 260 lb (117.9 kg)  11/21/20 266 lb (120.7 kg)    GEN: Well nourished, well developed in no acute distress NECK: No JVD; No carotid bruits CARDIAC: ***RRR, no murmurs, rubs, gallops RESPIRATORY:  Clear to auscultation without rales, wheezing or rhonchi  ABDOMEN: Soft, non-tender, non-distended EXTREMITIES:  No edema; No deformity   ASSESSMENT AND PLAN: .   Stage 1 HTN - high today - not resistant unless not controlled on 3 antihypertensives; if this is the case, will w/u for other causes.  Obesity contributing - telmisartan-norvasc 80-10 mg daily - add chlorthalidone  25 mg daily - continue ambulatory monitoring of Bp -  Recommend to continue with lifestyle modification and CVD risk mitigation (yearly A1c, lipid monitoring); we discussed cutting food proportions, healthier diet and exercise. Discussed reducing sodium in her diet, instructed < 2 g   DM2 A1c <7; on mounjaro and metformin        Dispo:  Blood pressure pharm visit in 3 months Follow up in 6 months  Signed, Klark Vanderhoef, Alben Spittle, MD

## 2022-11-03 ENCOUNTER — Encounter: Payer: Self-pay | Admitting: Internal Medicine

## 2022-11-03 ENCOUNTER — Ambulatory Visit: Payer: 59 | Attending: Internal Medicine | Admitting: Internal Medicine

## 2022-11-03 VITALS — BP 132/98 | HR 116 | Ht 64.0 in | Wt 256.4 lb

## 2022-11-03 DIAGNOSIS — I10 Essential (primary) hypertension: Secondary | ICD-10-CM

## 2022-11-03 MED ORDER — CHLORTHALIDONE 25 MG PO TABS: 25.0000 mg | ORAL_TABLET | Freq: Every day | ORAL | 3 refills | Status: AC

## 2022-11-03 NOTE — Patient Instructions (Signed)
Medication Instructions:  Your physician has recommended you make the following change in your medication:  1-Take Chlorthalidone 25 mg by mouth daily.  *If you need a refill on your cardiac medications before your next appointment, please call your pharmacy*  Lab Work: If you have labs (blood work) drawn today and your tests are completely normal, you will receive your results only by: MyChart Message (if you have MyChart) OR A paper copy in the mail If you have any lab test that is abnormal or we need to change your treatment, we will call you to review the results.  Testing/Procedures: None ordered today.  Follow-Up: At [facility], you and your health needs are our priority.  As part of our continuing mission to provide you with exceptional heart care, we have created designated Provider Care Teams.  These Care Teams include your primary Cardiologist (physician) and Advanced Practice Providers (APPs -  Physician Assistants and Nurse Practitioners) who all work together to provide you with the care you need, when you need it.  We recommend signing up for the patient portal called "MyChart".  Sign up information is provided on this After Visit Summary.  MyChart is used to connect with patients for Virtual Visits (Telemedicine).  Patients are able to view lab/test results, encounter notes, upcoming appointments, etc.  Non-urgent messages can be sent to your provider as well.   To learn more about what you can do with MyChart, go to [email or web address].    Your next appointment:   6 month(s)  Provider:   Maisie Fus, MD     Other Instructions You have been referred to pharmacy for medication and hypertension.

## 2022-11-13 ENCOUNTER — Ambulatory Visit: Payer: 59

## 2022-11-18 ENCOUNTER — Ambulatory Visit: Payer: 59

## 2023-02-23 ENCOUNTER — Ambulatory Visit: Payer: 59

## 2023-04-24 ENCOUNTER — Ambulatory Visit: Payer: 59 | Admitting: Internal Medicine

## 2023-05-06 NOTE — Progress Notes (Deleted)
  Cardiology Office Note:  .   Date:  05/06/2023  ID:  Brenda Casey, DOB 09/09/1985, MRN [id number] PCP: Patient, No Pcp Per  [facility] Providers Cardiologist:  Maisie Fus, MD    History of Present Illness: .   Brenda Casey is a 38 y.o. female DM2, obesity, HTN referral from [facility] for HTN. BP is 132/98 mmHg. She is on telmisartan 80-norvasc 10 mg daily. BP 132/98 mmHg. Her parents have hypertension.  At home Bps are around this range  ROS:  per HPI otherwise negative  Studies Reviewed: .          Risk Assessment/Calculations:    Physical Exam:   VS:  There were no vitals taken for this visit.   Wt Readings from Last 3 Encounters:  11/03/22 256 lb 6.4 oz (116.3 kg)  03/05/21 260 lb (117.9 kg)  11/21/20 266 lb (120.7 kg)    GEN: Well nourished, well developed in no acute distress NECK: No JVD CARDIAC: RRR, no murmurs, rubs, gallops RESPIRATORY:  Clear to auscultation without rales, wheezing or rhonchi  ABDOMEN: Soft, non-tender, non-distended EXTREMITIES:  No edema; No deformity   ASSESSMENT AND PLAN: .   Stage 1 HTN Obesity contributing - continue *** -  Recommend to continue with lifestyle modification and CVD risk mitigation (yearly A1c, lipid monitoring); we discussed cutting food proportions, healthier diet and exercise. Discussed reducing sodium in her diet, instructed < 2 g   DM2 A1c <7; on mounjaro and metformin        Dispo:  Follow up in 1 year with an APP ***  Signed, Brenda Casey, Alben Spittle, MD

## 2023-05-07 ENCOUNTER — Telehealth: Payer: Self-pay

## 2023-05-07 ENCOUNTER — Ambulatory Visit: Admitting: Internal Medicine

## 2023-05-07 NOTE — Telephone Encounter (Signed)
 Called patient to start virtual rooming process. Left message to call back.

## 2024-03-04 ENCOUNTER — Encounter: Payer: Self-pay | Admitting: Occupational Therapy

## 2024-03-04 ENCOUNTER — Other Ambulatory Visit: Payer: Self-pay

## 2024-03-04 ENCOUNTER — Ambulatory Visit: Attending: Optometry | Admitting: Occupational Therapy

## 2024-03-04 DIAGNOSIS — H5410 Blindness, one eye, low vision other eye, unspecified eyes: Secondary | ICD-10-CM | POA: Insufficient documentation

## 2024-03-04 DIAGNOSIS — R41842 Visuospatial deficit: Secondary | ICD-10-CM | POA: Insufficient documentation

## 2024-03-04 NOTE — Therapy (Signed)
 " OUTPATIENT OCCUPATIONAL THERAPY LOW VISION EVALUATION  Patient Name: Brenda Casey MRN: [id number] DOB:1985/12/05, 39 y.o., female Today's Date: 03/04/2024  PCP: Reggie Nian, Thora, NP  REFERRING PROVIDER: Nonah Camellia ORN, MD  END OF SESSION:  OT End of Session - 03/04/24 1241     Visit Number 1    Number of Visits 11    Date for Recertification  05/27/24    Authorization Type [employer] - requires auth    OT Start Time 1239    OT Stop Time 1316    OT Time Calculation (min) 37 min    Activity Tolerance Patient tolerated treatment well    Behavior During Therapy [facility] for tasks assessed/performed          Past Medical History:  Diagnosis Date   Diabetes mellitus without complication (HCC)    Essential hypertension    Hypertension affecting pregnancy 11/25/2018   [x]  Aspirin  81 mg daily after 12 weeks Current antihypertensives:  Labetalol  and Procardia  XL   Baseline and surveillance labs (pulled in from [facility], refresh links as needed)  Lab Results Component Value Date  PLT 341 12/08/2018  CREATININE 0.74 12/08/2018  AST 19 12/08/2018  ALT 16 12/08/2018   Antenatal Testing CHTN - O10.919  Group I   BP < 140/90, no meds, no preeclampsia, AGA, nml AFV   Group    Hypothyroidism    Intrauterine fetal death in pregnancy 05/10/2019   IUFD at less than 20 weeks of gestation May 10, 2019   IUGR (intrauterine growth restriction) affecting care of mother 03/18/2019   Morbid obesity (HCC) 07/30/2018   Last Assessment & Plan:  Uncontrolled patient and I discussed lifestyle and dietary measures to lower BMI, will continue to monitor.   Obesity    Pregnancy affected by fetal growth restriction 03/09/2019   440 gms, < 1st% at 25 weeks With absent end diastolic flow   Retinopathy    Severe preeclampsia May 10, 2019   Supervision of high risk pregnancy, antepartum 11/25/2018    Nursing Staff Provider Office Location  [facility] Dating  8 wk us  Language   English Anatomy US   ? Cleft lip and  palate, ? 2VC--limited due to body habitus, f/u scheduled Flu Vaccine  12/08/18 Genetic Screen  NIPS: insufficient DNA x 2  Declined further genetic testing [facility]: Silent Alpha Thal carrier, Gaucher Dz carrier TDaP vaccine    Hgb A1C or  GTT Type 2 DM Rhogam   NA   LAB RESULTS  Feedin   Thyroid  disease    Past Surgical History:  Procedure Laterality Date   DILATION AND EVACUATION N/A 05/10/2019   Procedure: DILATATION AND EVACUATION;  Surgeon: Barbra Lang PARAS, DO;  Location: [facility] LD ORS;  Service: Gynecology;  Laterality: N/A;   NO PAST SURGERIES     Patient Active Problem List   Diagnosis Date Noted   Postpartum care following vaginal delivery 06/09/2019   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 She feels that she is unable to use her computer as she needs to complete the job requirements of her position.   She has not been connected with Services for the Blind or Employment and Independence for People with Disabilities (Voc Rehab).   Pt accompanied  by: self  PERTINENT HISTORY: PMH: DM, HTN, hypothyroidism, retinopathy  PAIN:  Are you having pain? No  FALLS: Has patient fallen in last 6 months? 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; reading, movies, music, travel; not driving  PATIENT GOALS: get back to doing things she enjoys such as driving and traveling  OBJECTIVE:   GLASSES: reading glasses since surgery; wore glasses since she was 5 - reports severe astigmatism and was required to wear them for driving  CURRENT MODIFICATIONS/ADAPTIONS: inverted text, uses camera to enlarge printed items  DEVICES: kindle; Windows laptop for personal use and Windows laptop for work  PHONE: iPhone  HAND DOMINANCE: Right  ADLs and IADLs: TBA as time was attenuated   MOBILITY STATUS: Hand over hand assistance due to low vision; no AD  FUNCTIONAL OUTCOME MEASURES: PSFS:     Each score is rated by the patient on a scale from 0 to 10, in increments of 1. The higher the score, the less difficulty the patient encounters when performing the activity. Activities that rate closer to 10 are likely to be very little impaired and are performed in a similar manner as they used to be before the injury occurred. Average results closer to 0 indicate an increased loss of functionality due to the debilitating condition.  Total score = sum of the activity scores/number of activities Minimum detectable change (90%CI) for average score = 2 points Minimum detectable change (90%CI) for single activity score = 3 points  COGNITION: Overall cognitive status: Within functional limits for tasks assessed  VISUAL FINDINGS: Baseline vision: Wears glasses all the time and Legally blind Visual history: retinopathy and corrective eye surgery  Impaired depth perception, distance visual acuity  (20/200-2 on R and CF on L), and near acuity requiring magnification of text  OBSERVATIONS: Pt ambulates without use of AD. No loss of balance. The pt appears well kept and has glasses donned. Requires min A with ambulation for clinic navigation.   TODAY'S TREATMENT:                                                                                                                               OT educated pt on rehabilitation process and results of objective measures  in relation to pt specific goals.   OT provided information on general low vision recommendations, Services for the Blind, and Employment and Independence for People with Disabilities to initiate establishment of beneficial community resources.   PATIENT EDUCATION: Education details: OT Role and POC; general low vision recommendations, Services for the Blind, and Employment and Independence for People with Disabilities Person educated: Patient Education method: Explanation, handouts  Education comprehension: verbalized understanding and needs further education  HOME EXERCISE PROGRAM: 03/04/2024: general low vision recommendations and information for [place] and Independence for People with Disabilities  GOALS:  SHORT TERM GOALS: Target date: 04/01/2024    Patient will independently recall at least 2 compensatory strategies for visual impairment without cueing. Baseline: Goal status: INITIAL  2.  Patient will return demonstration of visual adaptation use and verbalize understanding of how to obtain item(s) if desired.  Baseline:  Goal status: INITIAL  LONG TERM GOALS: Target date: 05/27/2024  Patient will report at least two-point increase in average PSFS score or at least three-point increase in a single activity score indicating functionally significant improvement given minimum detectable change.  Baseline: 2.5 total score (See above for individual activity scores) Goal status:  INITIAL  ASSESSMENT:  CLINICAL IMPRESSION: Patient is a 39 y.o. y.o. female who was seen today for occupational therapy evaluation for assessment of vision related impairments to ADLs and IADLs. Hx includes DM, HTN, hypothyroidism, retinopathy. Patient currently reports difficulty reading, writing, use of technology, and work with limited participation in leisure activities as a part of functional deficits and impairments as noted below. Pt to benefit from skilled OT services for education on visual impairment(s), compensatory and safety strategies, and use of AD as needed for more independent and safe completion of daily occupations.   PERFORMANCE DEFICITS: in functional skills including ADLs, IADLs, mobility, decreased knowledge of precautions, decreased knowledge of use of DME, and vision.   IMPAIRMENTS: are limiting patient from ADLs, IADLs, work, leisure, and social participation.   CO-MORBIDITIES: may have co-morbidities  that affects occupational performance. Patient will benefit from skilled OT to address above impairments and improve overall function.  MODIFICATION OR ASSISTANCE TO COMPLETE EVALUATION: Min-Moderate modification of tasks or assist with assess necessary to complete an evaluation.  OT OCCUPATIONAL PROFILE AND HISTORY: Detailed assessment: Review of records and additional review of physical, cognitive, psychosocial history related to current functional performance.  CLINICAL DECISION MAKING: Moderate - several treatment options, min-mod task modification necessary  REHAB POTENTIAL: Good for goals stated  EVALUATION COMPLEXITY: Moderate    PLAN:  OT FREQUENCY: 1x/week  OT DURATION: 10 sessions  PLANNED INTERVENTIONS: self care/ADL training, therapeutic exercise, therapeutic activity, functional mobility training, patient/family education, visual/perceptual remediation/compensation, coping strategies training, DME and/or AE instructions, and  Re-evaluation  RECOMMENDED OTHER SERVICES: Employment and Independence for People with Disabilities; Services for the Blind   CONSULTED AND AGREED WITH PLAN OF CARE: Patient   PLAN FOR NEXT SESSION: assess limitations with ADLs and IADLs; Go over low vision packet   Jocelyn CHRISTELLA Bottom, OT 03/04/2024, 3:07 PM   For all possible CPT codes, reference the Planned Interventions line above.     Check all conditions that are expected to impact treatment: {Conditions expected to impact treatment:Morbid obesity and Diabetes mellitus   If treatment provided at initial evaluation, no treatment charged due to lack of authorization.           "

## 2024-03-04 NOTE — Patient Instructions (Signed)
 Employment and Independence for [employer] with Disabilities

## 2024-03-22 ENCOUNTER — Telehealth: Payer: Self-pay | Admitting: Occupational Therapy

## 2024-03-22 ENCOUNTER — Ambulatory Visit: Admitting: Occupational Therapy

## 2024-03-22 NOTE — Telephone Encounter (Signed)
 This is to document my attempt to call patient after no-show for OT appt this AM.  This is patient's # 1 missed appt but pt was unable to get out of her apartment complex due to recent winter weather.   Primary phone number(s) was used in efforts to contact the patient.   Spoke to patient to remind them of upcoming therapy visit next Tuesday at the same time.

## 2024-03-29 ENCOUNTER — Ambulatory Visit: Admitting: Occupational Therapy

## 2024-04-05 ENCOUNTER — Ambulatory Visit: Admitting: Occupational Therapy

## 2024-04-12 ENCOUNTER — Ambulatory Visit: Admitting: Occupational Therapy

## 2024-04-19 ENCOUNTER — Ambulatory Visit: Admitting: Occupational Therapy

## 2024-04-26 ENCOUNTER — Ambulatory Visit: Admitting: Occupational Therapy

## 2024-05-03 ENCOUNTER — Ambulatory Visit: Admitting: Occupational Therapy

## 2024-05-10 ENCOUNTER — Ambulatory Visit: Admitting: Occupational Therapy

## 2024-05-17 ENCOUNTER — Ambulatory Visit: Admitting: Occupational Therapy

## 2024-05-24 ENCOUNTER — Ambulatory Visit: Admitting: Occupational Therapy
